# Patient Record
Sex: Female | Born: 1985 | Race: White | Hispanic: No | Marital: Married | Smoking: Former smoker
Health system: Southern US, Community
[De-identification: ages and names within clinical notes are randomized; demographics above are authoritative.]

## PROBLEM LIST (undated history)

## (undated) DIAGNOSIS — R519 Headache, unspecified: Secondary | ICD-10-CM

## (undated) DIAGNOSIS — F319 Bipolar disorder, unspecified: Secondary | ICD-10-CM

## (undated) DIAGNOSIS — D649 Anemia, unspecified: Secondary | ICD-10-CM

## (undated) DIAGNOSIS — R51 Headache: Secondary | ICD-10-CM

---

## 2007-08-01 ENCOUNTER — Emergency Department: Payer: Self-pay | Admitting: Emergency Medicine

## 2008-06-13 ENCOUNTER — Emergency Department: Payer: Self-pay

## 2008-06-14 ENCOUNTER — Emergency Department: Payer: Self-pay | Admitting: Emergency Medicine

## 2009-05-27 ENCOUNTER — Emergency Department: Payer: Self-pay | Admitting: Emergency Medicine

## 2009-08-29 ENCOUNTER — Ambulatory Visit: Payer: Self-pay | Admitting: Family Medicine

## 2009-11-29 ENCOUNTER — Inpatient Hospital Stay: Payer: Self-pay

## 2010-01-08 ENCOUNTER — Observation Stay: Payer: Self-pay | Admitting: Obstetrics and Gynecology

## 2010-01-10 ENCOUNTER — Inpatient Hospital Stay: Payer: Self-pay | Admitting: Unknown Physician Specialty

## 2010-01-16 ENCOUNTER — Emergency Department: Payer: Self-pay | Admitting: Emergency Medicine

## 2012-07-22 ENCOUNTER — Emergency Department (HOSPITAL_COMMUNITY): Payer: Self-pay

## 2012-07-22 ENCOUNTER — Encounter (HOSPITAL_COMMUNITY): Payer: Self-pay | Admitting: *Deleted

## 2012-07-22 ENCOUNTER — Emergency Department (HOSPITAL_COMMUNITY)
Admission: EM | Admit: 2012-07-22 | Discharge: 2012-07-22 | Disposition: A | Discharge status: Home / Self Care | Payer: Self-pay | Attending: Emergency Medicine | Admitting: Emergency Medicine

## 2012-07-22 DIAGNOSIS — R6883 Chills (without fever): Secondary | ICD-10-CM | POA: Insufficient documentation

## 2012-07-22 DIAGNOSIS — R05 Cough: Secondary | ICD-10-CM | POA: Insufficient documentation

## 2012-07-22 DIAGNOSIS — R0989 Other specified symptoms and signs involving the circulatory and respiratory systems: Secondary | ICD-10-CM | POA: Insufficient documentation

## 2012-07-22 DIAGNOSIS — F17200 Nicotine dependence, unspecified, uncomplicated: Secondary | ICD-10-CM | POA: Insufficient documentation

## 2012-07-22 DIAGNOSIS — Z862 Personal history of diseases of the blood and blood-forming organs and certain disorders involving the immune mechanism: Secondary | ICD-10-CM | POA: Insufficient documentation

## 2012-07-22 DIAGNOSIS — R059 Cough, unspecified: Secondary | ICD-10-CM | POA: Insufficient documentation

## 2012-07-22 DIAGNOSIS — IMO0001 Reserved for inherently not codable concepts without codable children: Secondary | ICD-10-CM | POA: Insufficient documentation

## 2012-07-22 DIAGNOSIS — J111 Influenza due to unidentified influenza virus with other respiratory manifestations: Secondary | ICD-10-CM

## 2012-07-22 HISTORY — DX: Anemia, unspecified: D64.9

## 2012-07-22 MED ORDER — OSELTAMIVIR PHOSPHATE 75 MG PO CAPS: 75.0000 mg | ORAL_CAPSULE | Freq: Two times a day (BID) | ORAL | Status: DC

## 2012-07-22 NOTE — ED Provider Notes (Signed)
History  This chart was scribed for [name], MD by Shari Heritage, ED Scribe. The patient was seen in room APFT21/APFT21. Patient's care was started at 1528.  CSN: [id number]  Arrival date & time 07/22/12  1501   First MD Initiated Contact with Patient 07/22/12 1528      Chief Complaint  Patient presents with  . Chills  . Generalized Body Aches  . Cough     Patient is a 26 y.o. female presenting with cough. The history is provided by the patient. No language interpreter was used.  Cough This is a recurrent problem. The current episode started more than 1 week ago. The problem occurs constantly. The problem has been gradually worsening (says that she started producing sputum several days ago). The cough is productive of sputum. Associated symptoms include chills and myalgias. Pertinent negatives include no chest pain and no headaches. She has tried nothing for the symptoms. She is a smoker. Her past medical history does not include asthma.    HPI Comments: Alejandra Stevenson is a 26 y.o. female who presents to the Emergency Department complaining of generalized body aches and chills that started today. Patient states that she has also had a lingering cough for the past month, but this week she started producing sputum. There is associated congestion. Patient denies nausea, vomiting or diarrhea. Patient has a medical history of anemia, but no other chronic medical conditions. She is a current every day smoker.   Past Medical History  Diagnosis Date  . Anemia     History reviewed. No pertinent past surgical history.  History reviewed. No pertinent family history.  History  Substance Use Topics  . Smoking status: Current Every Day Smoker -- 0.5 packs/day    Types: Cigarettes  . Smokeless tobacco: Not on file  . Alcohol Use: Yes    OB History    Grav Para Term Preterm Abortions TAB SAB Ect Mult Living                  Review of Systems  Constitutional: Positive for chills.  Negative for fatigue.  HENT: Positive for congestion. Negative for sinus pressure and ear discharge.   Eyes: Negative for discharge.  Respiratory: Positive for cough.   Cardiovascular: Negative for chest pain.  Gastrointestinal: Negative for nausea, vomiting, abdominal pain and diarrhea.  Genitourinary: Negative for frequency and hematuria.  Musculoskeletal: Positive for myalgias. Negative for back pain.  Skin: Negative for rash.  Neurological: Negative for seizures and headaches.  Hematological: Negative.   Psychiatric/Behavioral: Negative for hallucinations.    Allergies  Review of patient's allergies indicates no known allergies.  Home Medications  No current outpatient prescriptions on file.  Triage Vitals: BP 124/80  Pulse 96  Temp 98.2 F (36.8 C) (Oral)  Resp 18  Ht 5\' 3"  (1.6 m)  Wt 114 lb (51.71 kg)  BMI 20.19 kg/m2  SpO2 99%  LMP 06/27/2012  Physical Exam  Constitutional: She is oriented to person, place, and time. She appears well-developed.  HENT:  Head: Normocephalic and atraumatic.  Eyes: Conjunctivae normal and EOM are normal. No scleral icterus.  Neck: Neck supple. No thyromegaly present.  Cardiovascular: Normal rate and regular rhythm.  Exam reveals no gallop and no friction rub.   No murmur heard. Pulmonary/Chest: No stridor. She has no wheezes. She has no rales. She exhibits no tenderness.  Abdominal: She exhibits no distension. There is no tenderness. There is no rebound.  Musculoskeletal: Normal range of motion. She exhibits  no edema.  Lymphadenopathy:    She has no cervical adenopathy.  Neurological: She is oriented to person, place, and time. Coordination normal.  Skin: No rash noted. No erythema.  Psychiatric: She has a normal mood and affect. Her behavior is normal.    ED Course  Procedures (including critical care time) DIAGNOSTIC STUDIES: Oxygen Saturation is 99% on room air, normal by my interpretation.    COORDINATION OF CARE: 3:32  PM- Patient informed of current plan for treatment and evaluation and agrees with plan at this time.     Labs Reviewed - No data to display No results found.   No diagnosis found.    MDM    The chart was scribed for me under my direct supervision.  I personally performed the history, physical, and medical decision making and all procedures in the evaluation of this patient.[name], MD 07/22/12 [phone]

## 2012-07-22 NOTE — ED Notes (Signed)
Pt co cough x1 month, body aches and chills started today, denies n/v/d

## 2014-10-29 ENCOUNTER — Emergency Department (HOSPITAL_COMMUNITY)
Admission: EM | Admit: 2014-10-29 | Discharge: 2014-10-29 | Disposition: A | Discharge status: Home / Self Care | Payer: Self-pay | Attending: Emergency Medicine | Admitting: Emergency Medicine

## 2014-10-29 ENCOUNTER — Encounter (HOSPITAL_COMMUNITY): Payer: Self-pay | Admitting: *Deleted

## 2014-10-29 DIAGNOSIS — Z862 Personal history of diseases of the blood and blood-forming organs and certain disorders involving the immune mechanism: Secondary | ICD-10-CM | POA: Insufficient documentation

## 2014-10-29 DIAGNOSIS — Z720 Tobacco use: Secondary | ICD-10-CM | POA: Insufficient documentation

## 2014-10-29 DIAGNOSIS — G43909 Migraine, unspecified, not intractable, without status migrainosus: Secondary | ICD-10-CM | POA: Insufficient documentation

## 2014-10-29 HISTORY — DX: Headache: R51

## 2014-10-29 HISTORY — DX: Headache, unspecified: R51.9

## 2014-10-29 MED ORDER — KETOROLAC TROMETHAMINE 30 MG/ML IJ SOLN
30.0000 mg | Freq: Once | INTRAMUSCULAR | Status: AC
  Administered 2014-10-29: 30 mg via INTRAVENOUS
  Filled 2014-10-29: qty 1

## 2014-10-29 MED ORDER — METOCLOPRAMIDE HCL 5 MG/ML IJ SOLN
10.0000 mg | Freq: Once | INTRAMUSCULAR | Status: AC
  Administered 2014-10-29: 10 mg via INTRAVENOUS
  Filled 2014-10-29: qty 2

## 2014-10-29 MED ORDER — DIPHENHYDRAMINE HCL 50 MG/ML IJ SOLN
25.0000 mg | Freq: Once | INTRAMUSCULAR | Status: AC
  Administered 2014-10-29: 25 mg via INTRAVENOUS
  Filled 2014-10-29: qty 1

## 2014-10-29 MED ORDER — SODIUM CHLORIDE 0.9 % IV BOLUS (SEPSIS)
1000.0000 mL | Freq: Once | INTRAVENOUS | Status: AC
  Administered 2014-10-29: 1000 mL via INTRAVENOUS

## 2014-10-29 NOTE — ED Notes (Signed)
Headache, for 2 days, Vomiting today, Hx of similar headaches.  No HI

## 2014-10-29 NOTE — ED Notes (Signed)
Pt states she has had headaches since she was little. Pt is alert and oriented x 3. Lights cut off and pt given a warm blanket

## 2014-10-29 NOTE — ED Provider Notes (Signed)
CSN: [id number]     Arrival date & time 10/29/14  1338 History   First MD Initiated Contact with Patient 10/29/14 1404     Chief Complaint  Patient presents with  . Headache     (Consider location/radiation/quality/duration/timing/severity/associated sxs/prior Treatment) HPI  29 year old female with a past history of migraines presents with a migraine headache for the past 2 days. Has been gradually worsening since onset 2 days ago. Patient states this is a right-sided headache that feels like an ache. It is a 10/10. No fevers or neck stiffness. No weakness or numbness besides generalized fatigue. Has had a long history of migraine similar to this but not as severe. Tried ibuprofen yesterday with no relief. Usually she just sleeps off her migraines but states that did not work this time. Patient gets migraines about once every 2-3 months. Besides being more severe this headache is not worse or different than normal. Has mild photophobia and has vomited a couple times today. Due to her headache she missed work today and was told she would need a doctor's note before returning.  Past Medical History  Diagnosis Date  . Anemia   . Headache    History reviewed. No pertinent past surgical history. History reviewed. No pertinent family history. History  Substance Use Topics  . Smoking status: Current Every Day Smoker -- 0.50 packs/day    Types: Cigarettes  . Smokeless tobacco: Not on file  . Alcohol Use: No   OB History    No data available     Review of Systems  Constitutional: Negative for fever and chills.  Eyes: Positive for photophobia. Negative for visual disturbance.  Gastrointestinal: Positive for nausea and vomiting.  Genitourinary: Negative for menstrual problem.  Neurological: Positive for headaches. Negative for weakness and numbness.  All other systems reviewed and are negative.     Allergies  Review of patient's allergies indicates no known allergies.  Home  Medications   Prior to Admission medications   Medication Sig Start Date End Date Taking? Authorizing Provider  oseltamivir (TAMIFLU) 75 MG capsule Take 1 capsule (75 mg total) by mouth every 12 (twelve) hours. 07/22/12   Bethann BerkshireJoseph Zammit, MD  Phenyleph-CPM-DM-Aspirin (ALKA-SELTZER PLUS COLD & COUGH) 7.02-21-09-325 MG TBEF Take 2 tablets by mouth once.    Historical Provider, MD   BP 120/77 mmHg  Pulse 85  Temp(Src) 98.4 F (36.9 C) (Oral)  Resp 18  Ht 5\' 6"  (1.676 m)  Wt 130 lb (58.968 kg)  BMI 20.99 kg/m2  SpO2 100%  LMP 10/28/2014 Physical Exam  Constitutional: She is oriented to person, place, and time. She appears well-developed and well-nourished.  HENT:  Head: Normocephalic and atraumatic.  Right Ear: External ear normal.  Left Ear: External ear normal.  Nose: Nose normal.  Eyes: EOM are normal. Pupils are equal, round, and reactive to light. Right eye exhibits no discharge. Left eye exhibits no discharge.  Neck: Normal range of motion. Neck supple.  No meningismus  Cardiovascular: Normal rate, regular rhythm and normal heart sounds.   Pulmonary/Chest: Effort normal and breath sounds normal.  Abdominal: Soft. There is no tenderness.  Neurological: She is alert and oriented to person, place, and time.  Reflex Scores:      Bicep reflexes are 2+ on the right side and 2+ on the left side.      Patellar reflexes are 2+ on the right side and 2+ on the left side. CN 2-12 grossly intact. 5/5 strength in all 4 extremities. Normal finger  to nose  Skin: Skin is warm and dry.  Nursing note and vitals reviewed.   ED Course  Procedures (including critical care time) Labs Review Labs Reviewed - No data to display  Imaging Review No results found.   EKG Interpretation None      MDM   Final diagnoses:  Migraine without status migrainosus, not intractable, unspecified migraine type    Patient with a typical but more severe right-sided migraine headache. Normal neurologic  exam. Highly doubt SAH, other intracranial/extra-axial bleeding, meningitis/encephalitis or thrombosis. Patient feels significantly improved after IV medicine. Stable for D/C home.    Pricilla Loveless, MD 10/29/14 1537

## 2014-10-29 NOTE — Discharge Instructions (Signed)

## 2016-05-07 ENCOUNTER — Encounter (HOSPITAL_COMMUNITY): Payer: Self-pay

## 2016-05-07 ENCOUNTER — Emergency Department (HOSPITAL_COMMUNITY)
Admission: EM | Admit: 2016-05-07 | Discharge: 2016-05-12 | Disposition: A | Discharge status: Other Acute Inpatient Hospital | Payer: Self-pay | Attending: Emergency Medicine | Admitting: Emergency Medicine

## 2016-05-07 DIAGNOSIS — F23 Brief psychotic disorder: Secondary | ICD-10-CM

## 2016-05-07 DIAGNOSIS — F309 Manic episode, unspecified: Secondary | ICD-10-CM | POA: Insufficient documentation

## 2016-05-07 DIAGNOSIS — F28 Other psychotic disorder not due to a substance or known physiological condition: Secondary | ICD-10-CM | POA: Insufficient documentation

## 2016-05-07 DIAGNOSIS — F3010 Manic episode without psychotic symptoms, unspecified: Secondary | ICD-10-CM

## 2016-05-07 DIAGNOSIS — F29 Unspecified psychosis not due to a substance or known physiological condition: Secondary | ICD-10-CM

## 2016-05-07 DIAGNOSIS — F312 Bipolar disorder, current episode manic severe with psychotic features: Secondary | ICD-10-CM

## 2016-05-07 DIAGNOSIS — Z87891 Personal history of nicotine dependence: Secondary | ICD-10-CM | POA: Insufficient documentation

## 2016-05-07 DIAGNOSIS — Z79899 Other long term (current) drug therapy: Secondary | ICD-10-CM | POA: Insufficient documentation

## 2016-05-07 LAB — CBC WITH DIFFERENTIAL/PLATELET
BASOS ABS: 0 10*3/uL (ref 0.0–0.1)
BASOS PCT: 0 %
EOS ABS: 0 10*3/uL (ref 0.0–0.7)
Eosinophils Relative: 0 %
HCT: 38.8 % (ref 36.0–46.0)
HEMOGLOBIN: 13.8 g/dL (ref 12.0–15.0)
Lymphocytes Relative: 10 %
Lymphs Abs: 1.3 10*3/uL (ref 0.7–4.0)
MCH: 28.9 pg (ref 26.0–34.0)
MCHC: 35.6 g/dL (ref 30.0–36.0)
MCV: 81.3 fL (ref 78.0–100.0)
MONO ABS: 0.3 10*3/uL (ref 0.1–1.0)
MONOS PCT: 3 %
NEUTROS ABS: 11.4 10*3/uL — AB (ref 1.7–7.7)
NEUTROS PCT: 87 %
Platelets: 260 10*3/uL (ref 150–400)
RBC: 4.77 MIL/uL (ref 3.87–5.11)
RDW: 12.3 % (ref 11.5–15.5)
WBC: 13 10*3/uL — ABNORMAL HIGH (ref 4.0–10.5)

## 2016-05-07 LAB — COMPREHENSIVE METABOLIC PANEL
ALBUMIN: 4.5 g/dL (ref 3.5–5.0)
ALT: 10 U/L — ABNORMAL LOW (ref 14–54)
ANION GAP: 9 (ref 5–15)
AST: 18 U/L (ref 15–41)
Alkaline Phosphatase: 56 U/L (ref 38–126)
CO2: 21 mmol/L — AB (ref 22–32)
Calcium: 9.3 mg/dL (ref 8.9–10.3)
Chloride: 106 mmol/L (ref 101–111)
Creatinine, Ser: 0.72 mg/dL (ref 0.44–1.00)
GFR calc Af Amer: >60 mL/min (ref >60)
GFR calc non Af Amer: >60 mL/min (ref >60)
GLUCOSE: 109 mg/dL — AB (ref 65–99)
POTASSIUM: 2.9 mmol/L — AB (ref 3.5–5.1)
SODIUM: 136 mmol/L (ref 135–145)
Total Bilirubin: 0.9 mg/dL (ref 0.3–1.2)
Total Protein: 7.7 g/dL (ref 6.5–8.1)

## 2016-05-07 LAB — RAPID URINE DRUG SCREEN, HOSP PERFORMED
AMPHETAMINES: NOT DETECTED
BARBITURATES: NOT DETECTED
Benzodiazepines: NOT DETECTED
Cocaine: NOT DETECTED
OPIATES: NOT DETECTED
TETRAHYDROCANNABINOL: NOT DETECTED

## 2016-05-07 LAB — ETHANOL: Alcohol, Ethyl (B): <5 mg/dL (ref <5)

## 2016-05-07 LAB — I-STAT BETA HCG BLOOD, ED (MC, WL, AP ONLY): I-stat hCG, quantitative: <5 m[IU]/mL (ref <5)

## 2016-05-07 LAB — SALICYLATE LEVEL: Salicylate Lvl: <7 mg/dL (ref 2.8–30.0)

## 2016-05-07 LAB — ACETAMINOPHEN LEVEL

## 2016-05-07 MED ORDER — STERILE WATER FOR INJECTION IJ SOLN
INTRAMUSCULAR | Status: AC
  Administered 2016-05-07: 1.2 mL
  Filled 2016-05-07: qty 10

## 2016-05-07 MED ORDER — POTASSIUM CHLORIDE CRYS ER 20 MEQ PO TBCR
40.0000 meq | EXTENDED_RELEASE_TABLET | Freq: Every day | ORAL | Status: DC
  Administered 2016-05-08 – 2016-05-11 (×3): 40 meq via ORAL
  Filled 2016-05-07 (×4): qty 2

## 2016-05-07 MED ORDER — ZIPRASIDONE MESYLATE 20 MG IM SOLR
20.0000 mg | Freq: Four times a day (QID) | INTRAMUSCULAR | Status: DC | PRN
  Administered 2016-05-07 – 2016-05-08 (×4): 20 mg via INTRAMUSCULAR
  Filled 2016-05-07 (×4): qty 20

## 2016-05-07 MED ORDER — POTASSIUM CHLORIDE CRYS ER 20 MEQ PO TBCR
40.0000 meq | EXTENDED_RELEASE_TABLET | Freq: Once | ORAL | Status: AC
  Administered 2016-05-07: 40 meq via ORAL
  Filled 2016-05-07: qty 2

## 2016-05-07 MED ORDER — LORAZEPAM 2 MG/ML IJ SOLN
2.0000 mg | Freq: Once | INTRAMUSCULAR | Status: AC
  Administered 2016-05-07: 2 mg via INTRAMUSCULAR
  Filled 2016-05-07: qty 1

## 2016-05-07 MED ORDER — STERILE WATER FOR INJECTION IJ SOLN
INTRAMUSCULAR | Status: AC
  Administered 2016-05-07: 1 mL
  Filled 2016-05-07: qty 10

## 2016-05-07 NOTE — ED Notes (Signed)
Bed: RESA Expected date:  Expected time:  Means of arrival:  Comments: EMS/[facility]. 

## 2016-05-07 NOTE — ED Notes (Signed)
Pt urinated on the floor after stripping clothes completely off.
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
## 2016-05-07 NOTE — BH Assessment (Signed)
TTS ordered for this patient. Patient was not able to be aroused. TTS consult removed and will be reordered when patient awakens.

## 2016-05-07 NOTE — ED Notes (Signed)
Per sitter, Pt will abruptly wake up, sit straight up in the bed, look alarmed, and then, lay back down.  Pt remains restrained d/t alarming behavior.

## 2016-05-07 NOTE — ED Notes (Signed)
Pt started to get OOB screaming about Jesus.

## 2016-05-07 NOTE — ED Provider Notes (Signed)
[facility]-EMERGENCY DEPT Provider Note   CSN: [id number] Arrival date & time: 05/07/16  1026     History   Chief Complaint Chief Complaint  Patient presents with  . Manic Behavior    HPI Alejandra Stevenson is a 30 y.o. female.  She presents accompanied by [employer] and EMS from her home. History is obtained from paramedics, EMS, and ultimately the patient's husband, and brother-in-law.  Pt is a Curator. Has never been "overly religious" per her husband's report.  [place] with women from her church this weekend. She returned Saturday. Husband states she was "tired and excited but normal". By Saturday night he states she was still rather excited but that she slept Saturday night and seemed normal yesterday on Sunday. By last night he states that she was acting strange and "hyper". Apparently she called ministers wife at midnight wanting to talk to her about "God".  Husband states that this morning she felt someone was in the house. She wanted someone to come over and "bless the house". On arrival here she feels that the paramedics or God and Jesus, and that I am the devil.    HPI  Past Medical History:  Diagnosis Date  . Anemia   . Headache     There are no active problems to display for this patient.   History reviewed. No pertinent surgical history.  OB History    No data available       Home Medications    Prior to Admission medications   Not on File    Family History History reviewed. No pertinent family history.  Social History Social History  Substance Use Topics  . Smoking status: Former Smoker    Packs/day: 0.50    Types: Cigarettes  . Smokeless tobacco: Never Used  . Alcohol use No     Allergies   Review of patient's allergies indicates no known allergies.   Review of Systems Review of Systems  Unable to perform ROS: Mental status change     Physical Exam Updated Vital Signs BP 127/91    Pulse 100   Temp 98.6 F (37 C) (Oral)   Resp 18   LMP  (LMP Unknown)   SpO2 100%   Physical Exam  Constitutional: She is oriented to person, place, and time. She appears well-developed and well-nourished. She appears distressed.  HENT:  Head: Normocephalic.  Eyes: Conjunctivae are normal. Pupils are equal, round, and reactive to light. No scleral icterus.  Neck: Normal range of motion. Neck supple. No thyromegaly present.  Cardiovascular: Normal rate and regular rhythm.  Exam reveals no gallop and no friction rub.   No murmur heard. Pulmonary/Chest: Effort normal and breath sounds normal. No respiratory distress. She has no wheezes. She has no rales.  Abdominal: Soft. Bowel sounds are normal. She exhibits no distension. There is no tenderness. There is no rebound.  Musculoskeletal: Normal range of motion. Deformity: secondary, the patient is hyperactive and manic. Intimately singing religious hymns. At times will stare at the ceiling states she sees god. She points at me and states that I am the devil. She feels that the 2 paramedics or Jesus and God. Attempts to remo.  Neurological: She is alert and oriented to person, place, and time.  Skin: Skin is warm and dry. No rash noted.  Psychiatric:  Attempts to remove monitoring devices and flee.     ED Treatments / Results  Labs (all labs ordered are listed,  but only abnormal results are displayed) Labs Reviewed  ACETAMINOPHEN LEVEL - Abnormal; Notable for the following:       Result Value   Acetaminophen (Tylenol), Serum <10 (*)    All other components within normal limits  CBC WITH DIFFERENTIAL/PLATELET - Abnormal; Notable for the following:    WBC 13.0 (*)    Neutro Abs 11.4 (*)    All other components within normal limits  COMPREHENSIVE METABOLIC PANEL - Abnormal; Notable for the following:    Potassium 2.9 (*)    CO2 21 (*)    Glucose, Bld 109 (*)    BUN <5 (*)    ALT 10 (*)    All other components within normal limits   ETHANOL  SALICYLATE LEVEL  RAPID URINE DRUG SCREEN, HOSP PERFORMED  I-STAT BETA HCG BLOOD, ED ([facility], [facility], [facility] ONLY)    EKG  EKG Interpretation None       Radiology No results found.  Procedures Procedures (including critical care time)  Medications Ordered in ED Medications  ziprasidone (GEODON) injection 20 mg (20 mg Intramuscular Given 05/07/16 1107)  sterile water (preservative free) injection (1.2 mLs  Given 05/07/16 1108)  LORazepam (ATIVAN) injection 2 mg (2 mg Intramuscular Given 05/07/16 1157)     Initial Impression / Assessment and Plan / ED Course  I have reviewed the triage vital signs and the nursing notes.  Pertinent labs & imaging results that were available during my care of the patient were reviewed by me and considered in my medical decision making (see chart for details).  Clinical Course    Patient with apparent psychotic break with delusions and hallucinations. Hyperreligious undertones. Was restrained for her safety. Feel she needs psychiatric care to prevent deterioration. Was given IM Geodon and IM Ativan and had improvement and was more calm. Awaiting for her to awaken from the sedative effects for psychiatric evaluation. Medical evaluation is unrevealing.  Final Clinical Impressions(s) / ED Diagnoses   Final diagnoses:  Manic behavior (HCC)  Other psychotic disorder not due to substance or known physiological condition    New Prescriptions New Prescriptions   No medications on file     Rolland PorterMark Ishanvi Mcquitty, MD 05/07/16 1701

## 2016-05-07 NOTE — ED Notes (Signed)
Remaining restraints removed from pt and pt ambulated to restroom without difficulty. Pt changed into purple paper scrubs. Sitter at bedside.

## 2016-05-07 NOTE — ED Provider Notes (Signed)
I assumed care of this patient from Dr. Fayrene FearingJames at 1700.  Please see their note for further details of Hx, PE.  Briefly patient is a 30 y.o. female with acute psychosis requiring chemical sedation.  Current plan is to follow up medical clearance labs [facility]  Labs with notable hypokalemia we'll replete. Otherwise labs are reassuring without evidence that would account for acute psychosis.  Patient awoke and stable. TTS consulted, who recommended inpatient treatment. Currently awaiting placement.    Nira ConnPedro Eduardo Tatum Massman, MD 05/08/16 [phone]

## 2016-05-07 NOTE — ED Triage Notes (Addendum)
Per [facility], Pt presents manic and hyper religious.  Pt's husband called EMS and reported this behavior has been going in x 4 hours.  [place]  EMS reports Pt attempted to jump off the stretcher several times en route.  Pt denies medical history and complaints.  Denies pain.  Denies drug and ETOH use.        Family Hx of ADHD and Bipolar.    During triage assessment, Pt jumped up, stripped off clothing, and proceeded to urinate in the floor.  Pt referring to staff as "Jesus" and "God."  Pt continually reports that staff "needs Jesus" and will laugh w/o known stimulus.  Pt will rub abdomen and smile.  LMP unknown.         MD at bedside and reports that he will IVC Pt.

## 2016-05-07 NOTE — ED Notes (Signed)
Pt continues to scream, yelling "God I need you right now!"  States she sees Jesus with the light on or off.
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
## 2016-05-07 NOTE — ED Notes (Signed)
Report given to Barbara RN

## 2016-05-07 NOTE — ED Notes (Signed)
Right wrist restraint released after pt calm and cooperative at this time. To reassess pt in 15 min for further limb release.

## 2016-05-07 NOTE — ED Notes (Signed)
TTS consult discontinued until Pt is more alert.

## 2016-05-07 NOTE — Progress Notes (Signed)
Pt without completed ED registration  Pt noted throughout shift to be hyper religious, casting out satan, etc Pt in restraints with sitter at bedside Cm introduced herself and inquired if pt has a family doctor or pcp Pt informs CM she seems to remember having one in the hospital as "mark james" Pt is correct there is an ECP with that name but not a pcp  Pt offered uninsured [place] resources placed in her pt belonging bag in which she said "ok" to when offered Cm returned to ask pt her name and she informed CM her name was " Mauri Readingamanda Spickard, my husband name is jimmy Longie" and stated she had a daughter Pt rubbed her stomach and stated she had "one not yet born. I'm going to name Kyla.  I may not. I'm going to think about that. God will help me with a name."  When asked how many months she was Pt stated "about three months"   ED CM left pt uninsured [place] [facility] in her pt belonging bag at nursing station   CM provided written information to assist pt with determining choice for uninsured accepting pcps, discussed the importance of pcp vs EDP services for f/u care, www.needymeds.org, www.goodrx.com, discounted pharmacies and other [place] resources such as [facility] , [facility], affordable care act, financial assistance, uninsured dental services, [place] med assist, DSS and  health department  Provided resources for [place] uninsured accepting pcps like [facility], family medicine at [address], [facility], [facility], local urgent care centers, [facility], [facility], general medical clinics, family services of the [facility], [facility] urgent care plus others, medication resources, [facility] out patient pharmacies and housing Provided [facility] contact information

## 2016-05-08 DIAGNOSIS — Z87891 Personal history of nicotine dependence: Secondary | ICD-10-CM

## 2016-05-08 DIAGNOSIS — F309 Manic episode, unspecified: Secondary | ICD-10-CM

## 2016-05-08 LAB — BASIC METABOLIC PANEL
ANION GAP: 8 (ref 5–15)
BUN: 5 mg/dL — AB (ref 6–20)
CALCIUM: 9.4 mg/dL (ref 8.9–10.3)
CO2: 23 mmol/L (ref 22–32)
Chloride: 109 mmol/L (ref 101–111)
Creatinine, Ser: 0.65 mg/dL (ref 0.44–1.00)
GFR calc Af Amer: >60 mL/min (ref >60)
Glucose, Bld: 102 mg/dL — ABNORMAL HIGH (ref 65–99)
POTASSIUM: 3.6 mmol/L (ref 3.5–5.1)
SODIUM: 140 mmol/L (ref 135–145)

## 2016-05-08 MED ORDER — TRAZODONE HCL 100 MG PO TABS
100.0000 mg | ORAL_TABLET | Freq: Every day | ORAL | Status: DC
  Administered 2016-05-08 – 2016-05-11 (×4): 100 mg via ORAL
  Filled 2016-05-08 (×5): qty 1

## 2016-05-08 MED ORDER — STERILE WATER FOR INJECTION IJ SOLN
INTRAMUSCULAR | Status: AC
  Administered 2016-05-08: 02:00:00
  Filled 2016-05-08: qty 10

## 2016-05-08 MED ORDER — OLANZAPINE 10 MG PO TBDP: 10.0000 mg | ORAL_TABLET | Freq: Two times a day (BID) | ORAL | Status: DC

## 2016-05-08 MED ORDER — STERILE WATER FOR INJECTION IJ SOLN
INTRAMUSCULAR | Status: AC
  Administered 2016-05-08: 10 mL
  Filled 2016-05-08: qty 10

## 2016-05-08 MED ORDER — LORAZEPAM 2 MG/ML IJ SOLN
2.0000 mg | Freq: Once | INTRAMUSCULAR | Status: AC
  Administered 2016-05-08: 2 mg via INTRAMUSCULAR
  Filled 2016-05-08: qty 1

## 2016-05-08 MED ORDER — ASENAPINE MALEATE 5 MG SL SUBL
10.0000 mg | SUBLINGUAL_TABLET | Freq: Two times a day (BID) | SUBLINGUAL | Status: DC
  Administered 2016-05-08 – 2016-05-11 (×6): 10 mg via SUBLINGUAL
  Filled 2016-05-08 (×9): qty 2
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
## 2016-05-08 NOTE — Progress Notes (Signed)
Pt running around room naked, can not get pt to get in to the bed.  Geodon not working yet. Sitter present, pt is ivc'd. Alejandra Stevenson P [name]

## 2016-05-08 NOTE — BH Assessment (Signed)
[facility] Assessment Progress Note  Per Thedore MinsMojeed Akintayo, MD, this pt requires psychiatric hospitalization at this time.  The following facilities have been contacted to seek placement for this pt, with results as noted:  Beds available, information sent, decision pending:  [name]   At capacity:  [name] ([name]   [name], [place] Triage Specialist [phone]
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
## 2016-05-09 ENCOUNTER — Emergency Department (HOSPITAL_COMMUNITY): Payer: Self-pay

## 2016-05-09 ENCOUNTER — Other Ambulatory Visit: Payer: Self-pay

## 2016-05-09 DIAGNOSIS — F312 Bipolar disorder, current episode manic severe with psychotic features: Secondary | ICD-10-CM | POA: Diagnosis present

## 2016-05-09 LAB — TSH: TSH: 0.611 u[IU]/mL (ref 0.350–4.500)

## 2016-05-09 LAB — URINALYSIS, ROUTINE W REFLEX MICROSCOPIC
BILIRUBIN URINE: NEGATIVE
Glucose, UA: NEGATIVE mg/dL
HGB URINE DIPSTICK: NEGATIVE
Ketones, ur: 15 mg/dL — AB
Leukocytes, UA: NEGATIVE
NITRITE: NEGATIVE
PROTEIN: NEGATIVE mg/dL
SPECIFIC GRAVITY, URINE: 1.018 (ref 1.005–1.030)
pH: 6 (ref 5.0–8.0)

## 2016-05-09 LAB — VITAMIN B12: Vitamin B-12: 337 pg/mL (ref 180–914)

## 2016-05-09 LAB — FOLATE: Folate: 15.8 ng/mL (ref >5.9)

## 2016-05-09 LAB — PREGNANCY, URINE: PREG TEST UR: NEGATIVE

## 2016-05-09 MED ORDER — LORAZEPAM 1 MG PO TABS
1.0000 mg | ORAL_TABLET | Freq: Four times a day (QID) | ORAL | Status: DC | PRN
  Filled 2016-05-09: qty 1

## 2016-05-09 MED ORDER — DIPHENHYDRAMINE HCL 50 MG/ML IJ SOLN
50.0000 mg | Freq: Once | INTRAMUSCULAR | Status: AC
  Administered 2016-05-09: 50 mg via INTRAMUSCULAR
  Filled 2016-05-09: qty 1

## 2016-05-09 MED ORDER — LORAZEPAM 2 MG/ML IJ SOLN
2.0000 mg | Freq: Once | INTRAMUSCULAR | Status: AC
  Administered 2016-05-09: 2 mg via INTRAMUSCULAR
  Filled 2016-05-09: qty 1

## 2016-05-09 MED ORDER — LORAZEPAM 1 MG PO TABS
1.0000 mg | ORAL_TABLET | Freq: Three times a day (TID) | ORAL | Status: DC
  Administered 2016-05-09 – 2016-05-10 (×2): 1 mg via ORAL
  Filled 2016-05-09 (×3): qty 1

## 2016-05-09 MED ORDER — ZIPRASIDONE MESYLATE 20 MG IM SOLR
20.0000 mg | Freq: Once | INTRAMUSCULAR | Status: AC
  Administered 2016-05-09: 20 mg via INTRAMUSCULAR
  Filled 2016-05-09: qty 20

## 2016-05-09 MED ORDER — OXCARBAZEPINE 300 MG PO TABS
300.0000 mg | ORAL_TABLET | Freq: Two times a day (BID) | ORAL | Status: DC
  Administered 2016-05-09 – 2016-05-11 (×4): 300 mg via ORAL
  Filled 2016-05-09 (×5): qty 1
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
## 2016-05-09 NOTE — BH Assessment (Signed)
[facility] Assessment Progress Note  Per Thedore MinsMojeed Akintayo, MD, this pt continues to require psychiatric hospitalization at this time.  The following facilities have been contacted to seek placement for this pt, with results as noted:  Beds available, information sent, decision pending:  [facility]   At capacity:  [facility]   Lanessa Shill, [place] Triage Specialist [phone]

## 2016-05-09 NOTE — BH Assessment (Addendum)
BHH Assessment Progress Note  At the request of Thedore MinsMojeed Akintayo, MD, this writer called pt's husband, Marice PotterJames Weimar [phone]), to obtain history.  Call was placed from 14:07 - 14:28.  Mr Janina MayoWilkes reports that, to his knowledge, pt has no history of mental health problems.  She has never been hospitalized for psychiatric treatment, has never seen a behavioral health professional for outpatient treatment, and has never been on psychotropic medications.  He reports that shortly after delivering their 32 year old son, she had a brief period of mild lethargy and anhedonia, but this resolved without any professional help.  When asked if pt is currently on medications of any kind, or if she went through a recent medication change, he reports that she takes ibuprofen occasionally.  About a year ago she was given an oral medication, possibly Z-pack, for bronchitis.  He denies any recent use of steroid medications.  She has no reported history of a seizure disorder.  When asked about problems with urinary tract infections, he replies that when she was pregnant with their 32 year old son, she did have a couple UTI's, but clarified that these were yeast infections.  When asked if pt has any history of substance abuse, Mr Janina MayoWilkes reports that in the past she has drank alcohol, specifying 2 - 3 beers or a mixed drink, but only occasionally and socially.  She has not used alcohol in the past 1 - 1.5 years.  When asked about stressors, Mr Janina MayoWilkes reports a number of deaths in the family in the past year, including pt's father, pt's uncle, Mr Janina MayoWilkes step-father, and his uncle.  Mr Janina MayoWilkes confirms pt's report that Child Protective Services visited their home about 2 - 3 weeks ago.  He reports that they had a conflict with a neighbor that resulted in the neighbor calling CPS apparently out of spite.  CPS visited, but did not know what they were supposed to be looking for; they reported that they will be returning for a follow  up, but Mr Janina MayoWilkes minimizes any concern about the outcome of this.  A couple weeks earlier he reports that they had a dispute with their landlord, that could have resulted in eviction, about the condition of the property.  As a result they have had to get rid of their pets, including the pt's dog that used to follow her around the house.  Recently one of their two cars broke down, creating some transportation related stress.  Additionally, their 32 year old son has problems with inattentiveness, and [facility].  However, Mr Janina MayoWilkes reports that their marriage is intact.  He denies any unusual financial stressors.  Mr Janina MayoWilkes reports that pt's father was diagnosed with Bipolar Disorder.  He notes that when he visited his wife in the ED today she started saying, "I forgive you daddy," repeatedly.  He did not know what this might reference, and so he asked pt's mother, who also has no context in which to place the statement.  I then asked him about the names of two people that pt mentioned in her assessment, Jerolyn ShinLeroy Pinnix and Lexmark InternationalCharles Justice.  He believes that they may have been acquaintances or co-workers of pt's father, but is uncertain about this.  These details will be staffed with Dr [name] tomorrow, 05/10/2016.  Doylene Canninghomas Jalynn Betzold, MA Triage Specialist [phone]

## 2016-05-09 NOTE — ED Notes (Signed)
Redness noted on patients elbows from shearing/ pushing herself up onto her elbows in bed. No breakdown noted. Allevyn foam border gauze applied to each elbow.

## 2016-05-09 NOTE — Progress Notes (Signed)
Pt also provided with a list of free clinics for [place] area

## 2016-05-09 NOTE — Progress Notes (Signed)
[facility]ed at [facility] progression meeting ; recommendation for further ED labs to check pregnancy and UA

## 2016-05-09 NOTE — ED Notes (Signed)
Patient slept short time, she is now spitting and trying to make herself gag.

## 2016-05-09 NOTE — ED Notes (Signed)
Pt is now following directions, she is awake and knows that she is at the hospital but not quite sure why. She has been taken out of restraints and is cooperating with staff and no longer displaying actions of trying harm herself or staff. Pt explained that if she begins to display those behaviors again and becomes a harm to either herself or staff again she will be placed back into restraints. Pt verbalizes understanding and contracts safety. Pt walked to bathroom and dinner tray finished, Will continue to monitor.
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
## 2016-05-09 NOTE — ED Notes (Signed)
Attempted to complete CT scan but pt was not cooperative. Pt refused to follow commands and nursing staff had to have the assistance of security to get pt back into the bed and back into her 4 point restraints. Will notify MD and continue to monitor the pt.

## 2016-05-09 NOTE — ED Notes (Addendum)
Patient is talking to self, and people not in room, as well as to the sitter and singing songs.  Patient stated that "our  daddy said it was ok to get naked for Jesus."

## 2016-05-09 NOTE — ED Notes (Signed)
Pt took body wash that was in her room,and placed in the cup, swished contents in her mouth and spit on the floor. She also took bedside table in the bathroom with her as she sat on the commode to void and defecate.  The items were removed and taken out of her room.

## 2016-05-10 LAB — VITAMIN D 25 HYDROXY (VIT D DEFICIENCY, FRACTURES): Vit D, 25-Hydroxy: 17.6 ng/mL — ABNORMAL LOW (ref 30.0–100.0)

## 2016-05-10 LAB — T4: T4 TOTAL: 10.6 ug/dL (ref 4.5–12.0)

## 2016-05-10 LAB — T3: T3 TOTAL: 120 ng/dL (ref 71–180)

## 2016-05-10 MED ORDER — ZIPRASIDONE MESYLATE 20 MG IM SOLR
20.0000 mg | Freq: Once | INTRAMUSCULAR | Status: AC
  Administered 2016-05-10: 20 mg via INTRAMUSCULAR
  Filled 2016-05-10: qty 20

## 2016-05-10 MED ORDER — STERILE WATER FOR INJECTION IJ SOLN
INTRAMUSCULAR | Status: AC
  Administered 2016-05-10: 10 mL
  Filled 2016-05-10: qty 10

## 2016-05-10 MED ORDER — CLONAZEPAM 1 MG PO TABS
1.0000 mg | ORAL_TABLET | Freq: Two times a day (BID) | ORAL | Status: DC
  Administered 2016-05-10 – 2016-05-12 (×5): 1 mg via ORAL
  Filled 2016-05-10 (×5): qty 1

## 2016-05-10 MED ORDER — LORAZEPAM 1 MG PO TABS
1.0000 mg | ORAL_TABLET | Freq: Once | ORAL | Status: AC
  Administered 2016-05-10: 1 mg via ORAL
  Filled 2016-05-10: qty 1

## 2016-05-10 NOTE — BH Assessment (Signed)
BHH Assessment Progress Note    This clinician contacted pending facilities.  [facility], [facility],  [facility]- left a message for [facility]- nurse in intake, [facility] spoke with Diane. They have the patients paperwork. Still considering referral. Will call if they have a bed.

## 2016-05-10 NOTE — ED Notes (Signed)
Pt husband at bedside

## 2016-05-10 NOTE — ED Notes (Signed)
Safety check- This nurse and Tarri Glennheressa, MHT to search person for contraband. No contraband found,

## 2016-05-10 NOTE — Progress Notes (Signed)
Attending Provider and Psychiatry Provider made aware of Persistent Tachycardia. Patient is anxious but calm when redirected. Will continue to monitor patient.

## 2016-05-10 NOTE — Progress Notes (Signed)
Patient is calm and cooperative when administrating medications. Patient is taking medications whole with liquids. Patient is anxious, but easily redirected.

## 2016-05-10 NOTE — BHH Counselor (Signed)
BHH Assessment Progress Note  Writer re-assessed pt this morning. Pt was pleasant and indicated that she was doing a lot better. Pt then shared with writer that she doesn't think she's [name] anymore, but is thinking that her name is now Alejandra Stevenson. Pt added that she woke up feeling funny and she thinks its vivid dreams.   Dr. [name] and [name], DNP, continue to recommend IP treatment for pt.   Johny ShockSamantha M. Ladona Ridgelaylor, MS, NCC, LPCA Counselor

## 2016-05-10 NOTE — Progress Notes (Signed)
05/10/16 1346:  Pt was sleep.  Caroll RancherMarjette Leilyn Stevenson, LRT/CTRS

## 2016-05-10 NOTE — ED Notes (Signed)
EDP consulted in regards to pt pulse. No new orders at this time. Will continue to monitor.
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
## 2016-05-11 DIAGNOSIS — F312 Bipolar disorder, current episode manic severe with psychotic features: Secondary | ICD-10-CM

## 2016-05-11 DIAGNOSIS — Z79899 Other long term (current) drug therapy: Secondary | ICD-10-CM

## 2016-05-11 MED ORDER — IBUPROFEN 200 MG PO TABS
600.0000 mg | ORAL_TABLET | Freq: Four times a day (QID) | ORAL | Status: DC | PRN
  Administered 2016-05-11: 600 mg via ORAL
  Filled 2016-05-11: qty 3

## 2016-05-11 MED ORDER — ASENAPINE MALEATE 5 MG SL SUBL
5.0000 mg | SUBLINGUAL_TABLET | Freq: Two times a day (BID) | SUBLINGUAL | Status: DC
  Administered 2016-05-11 – 2016-05-12 (×2): 5 mg via SUBLINGUAL
  Filled 2016-05-11 (×2): qty 1

## 2016-05-11 MED ORDER — NEOMYCIN-POLYMYXIN-PRAMOXINE 1 % EX CREA: TOPICAL_CREAM | Freq: Two times a day (BID) | CUTANEOUS | Status: DC

## 2016-05-11 MED ORDER — OXCARBAZEPINE 150 MG PO TABS
150.0000 mg | ORAL_TABLET | Freq: Two times a day (BID) | ORAL | Status: DC
  Administered 2016-05-11 – 2016-05-12 (×2): 150 mg via ORAL
  Filled 2016-05-11 (×2): qty 1

## 2016-05-11 MED ORDER — BACITRACIN-NEOMYCIN-POLYMYXIN 400-5-5000 EX OINT
TOPICAL_OINTMENT | Freq: Two times a day (BID) | CUTANEOUS | Status: DC
  Administered 2016-05-11: 1 via TOPICAL
  Administered 2016-05-11: 21:00:00 via TOPICAL
  Administered 2016-05-12: 1 via TOPICAL
  Filled 2016-05-11 (×3): qty 1
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
## 2016-05-11 NOTE — ED Notes (Signed)
This AM patient had 1 ear plug in, pointed at it when nurse came in stating "I plugged this ear so I can't hear the devil, the other one is open for God." Stated she felt "Good, I've been listening to Patient throughout day disclosing delusions of being pregnant.  At one point came to staff with trail of fluid behind her in hallway, sheets were saturated with with liquid.  Stated "my water broke."  Patient prompted by female nurse to get in shower, given change of clothes, room and floor cleaned by housekeeping, new linens provided.  Patient asked for another pregnancy test, she has one blood and urine test resulted and negative.  Oriented x4.  Was asking for EKG today, reporting fear that she might have a heart attack.  EKG taken as patient was tachycardic, denied chest pain and shortness of breath.  Orthostatic vitals taken ,EKG taken, showed sinus tachycardia.  Had 144 pulse when standing.  Provider- NP notified and aware, no new orders received.   Encouraged to drink fluids.  Patient taking medicines without protest.  Able to reorient patient from delusions, but she returns to them quickly.

## 2016-05-11 NOTE — Progress Notes (Signed)
05/11/16 1338:  Pt was sleep.   Alejandra RancherMarjette Reather Steller, LRT/CTRS

## 2016-05-11 NOTE — ED Notes (Signed)
Patient obsessed with menstrual cycle and states " I'm getting ready to have baby Jesus". Encouragement and support provided and safety maintain. Q 15 min safety checks remain in place.

## 2016-05-11 NOTE — ED Notes (Signed)
Pt encouraged to increase water intake per nursing order.

## 2016-05-11 NOTE — ED Notes (Signed)
When Clinical research associatewriter took pt her medication pt commented that she "didn't need to swallow them all the way." when taking the medication she took them into her mouth, drank, and then pulled the remaining solid pills out of her mouth. When writer asked her to open her hand to reveal the pills, writer informed pt that the next step if she continued to behave this way would be to receive then medications in shot form. Pt picked back up the pills and took them. Pt appeared more confused by the interaction than really refusing medication. Pt also drank full cup of water with pills at nurse encouragement.
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
## 2016-05-11 NOTE — ED Notes (Signed)
Patient has to constantly be re-directed to her room. Patient has been talking on phone while phone is turned off. Patient appears confused and is repetitive in her conversation/

## 2016-05-11 NOTE — ED Notes (Signed)
SBAR Report received from previous nurse. Pt received calm and visible on unit. Pt denies current SI/ HI, A/V H, depression, anxiety, and pain at this time, and is otherwise stable. Pt reminded of camera surveillance, q 15 min rounds, and rules of the milieu. Pt screened for contraband by writer, will continue to assess. 

## 2016-05-12 ENCOUNTER — Encounter: Payer: Self-pay | Admitting: Psychiatry

## 2016-05-12 ENCOUNTER — Inpatient Hospital Stay
Admission: RE | Admit: 2016-05-12 | Discharge: 2016-05-21 | DRG: 885 | Disposition: A | Discharge status: Home / Self Care | Payer: No Typology Code available for payment source | Source: Intra-hospital | Attending: Psychiatry | Admitting: Psychiatry

## 2016-05-12 DIAGNOSIS — G4700 Insomnia, unspecified: Secondary | ICD-10-CM | POA: Diagnosis present

## 2016-05-12 DIAGNOSIS — Z818 Family history of other mental and behavioral disorders: Secondary | ICD-10-CM | POA: Diagnosis not present

## 2016-05-12 DIAGNOSIS — F312 Bipolar disorder, current episode manic severe with psychotic features: Secondary | ICD-10-CM | POA: Diagnosis present

## 2016-05-12 DIAGNOSIS — Z23 Encounter for immunization: Secondary | ICD-10-CM

## 2016-05-12 DIAGNOSIS — E876 Hypokalemia: Secondary | ICD-10-CM | POA: Diagnosis present

## 2016-05-12 DIAGNOSIS — Z87891 Personal history of nicotine dependence: Secondary | ICD-10-CM

## 2016-05-12 DIAGNOSIS — F419 Anxiety disorder, unspecified: Secondary | ICD-10-CM | POA: Diagnosis present

## 2016-05-12 DIAGNOSIS — R000 Tachycardia, unspecified: Secondary | ICD-10-CM | POA: Diagnosis present

## 2016-05-12 DIAGNOSIS — E871 Hypo-osmolality and hyponatremia: Secondary | ICD-10-CM | POA: Diagnosis not present

## 2016-05-12 HISTORY — DX: Bipolar disorder, unspecified: F31.9

## 2016-05-12 LAB — CBG MONITORING, ED: Glucose-Capillary: 93 mg/dL (ref 65–99)

## 2016-05-12 MED ORDER — MAGNESIUM HYDROXIDE 400 MG/5ML PO SUSP: 30.0000 mL | Freq: Every day | ORAL | Status: DC | PRN

## 2016-05-12 MED ORDER — IBUPROFEN 600 MG PO TABS: 600.0000 mg | ORAL_TABLET | Freq: Four times a day (QID) | ORAL | Status: DC | PRN

## 2016-05-12 MED ORDER — ASENAPINE MALEATE 5 MG SL SUBL: 5.0000 mg | SUBLINGUAL_TABLET | Freq: Two times a day (BID) | SUBLINGUAL | Status: DC

## 2016-05-12 MED ORDER — CLONAZEPAM 1 MG PO TABS: 1.0000 mg | ORAL_TABLET | Freq: Two times a day (BID) | ORAL | Status: DC

## 2016-05-12 MED ORDER — TRAZODONE HCL 100 MG PO TABS: 100.0000 mg | ORAL_TABLET | Freq: Every day | ORAL | Status: DC

## 2016-05-12 MED ORDER — ALUM & MAG HYDROXIDE-SIMETH 200-200-20 MG/5ML PO SUSP: 30.0000 mL | ORAL | Status: DC | PRN

## 2016-05-12 MED ORDER — LORAZEPAM 2 MG PO TABS
2.0000 mg | ORAL_TABLET | ORAL | Status: DC | PRN
  Administered 2016-05-13: 2 mg via ORAL
  Filled 2016-05-12: qty 1

## 2016-05-12 MED ORDER — OXCARBAZEPINE 150 MG PO TABS
300.0000 mg | ORAL_TABLET | Freq: Two times a day (BID) | ORAL | Status: DC
  Administered 2016-05-12 – 2016-05-14 (×4): 300 mg via ORAL
  Filled 2016-05-12 (×4): qty 2

## 2016-05-12 MED ORDER — ACETAMINOPHEN 325 MG PO TABS
650.0000 mg | ORAL_TABLET | Freq: Four times a day (QID) | ORAL | Status: DC | PRN
  Administered 2016-05-13: 650 mg via ORAL
  Filled 2016-05-12: qty 2

## 2016-05-12 MED ORDER — TEMAZEPAM 15 MG PO CAPS
15.0000 mg | ORAL_CAPSULE | Freq: Every day | ORAL | Status: DC
  Administered 2016-05-12: 15 mg via ORAL
  Filled 2016-05-12: qty 1

## 2016-05-12 MED ORDER — POTASSIUM CHLORIDE 20 MEQ PO PACK
20.0000 meq | PACK | Freq: Every day | ORAL | Status: DC
  Administered 2016-05-12 – 2016-05-14 (×3): 20 meq via ORAL
  Filled 2016-05-12 (×5): qty 1

## 2016-05-12 MED ORDER — ASENAPINE MALEATE 5 MG SL SUBL
10.0000 mg | SUBLINGUAL_TABLET | Freq: Two times a day (BID) | SUBLINGUAL | Status: DC
  Administered 2016-05-12 – 2016-05-14 (×4): 10 mg via SUBLINGUAL
  Filled 2016-05-12 (×5): qty 2

## 2016-05-12 MED ORDER — BACITRACIN-NEOMYCIN-POLYMYXIN 400-5-5000 EX OINT
TOPICAL_OINTMENT | Freq: Two times a day (BID) | CUTANEOUS | Status: DC
  Administered 2016-05-12 – 2016-05-13 (×2): 1 via TOPICAL
  Administered 2016-05-13: 18:00:00 via TOPICAL
  Administered 2016-05-14 – 2016-05-20 (×14): 1 via TOPICAL
  Filled 2016-05-12 (×18): qty 1

## 2016-05-12 MED ORDER — ACETAMINOPHEN 325 MG PO TABS: 650.0000 mg | ORAL_TABLET | Freq: Four times a day (QID) | ORAL | Status: DC | PRN

## 2016-05-12 MED ORDER — INFLUENZA VAC SPLIT QUAD 0.5 ML IM SUSY
0.5000 mL | PREFILLED_SYRINGE | INTRAMUSCULAR | Status: AC
  Administered 2016-05-13: 0.5 mL via INTRAMUSCULAR
  Filled 2016-05-12: qty 0.5

## 2016-05-12 MED ORDER — OXCARBAZEPINE 150 MG PO TABS: 150.0000 mg | ORAL_TABLET | Freq: Two times a day (BID) | ORAL | Status: DC

## 2016-05-12 NOTE — Progress Notes (Signed)
Pt requiring frequent reorientation to room/unit. Mother and brother present for visittation, pt confused and thinks she will be discharging with mother to go home. Pt reoriented and explained length of stay/treatment plan. Cooperative with redirection. Appears confused, and admits "I don't know what I was thinking." Tearful. Safety maintained. Pt taken to her mother and brother awaiting her in the dayroom. Will continue to monitor.

## 2016-05-12 NOTE — BH Assessment (Addendum)
Patient has been [facility] and [facility] Ardyth HarpsHernandez accepting per [name]. Patient will be transported via law enforcement due to being under involuntary commitment. Patients nurse and Dr. [name] and [name], DNP informed of acceptance. Call nurse report to [phone].  Alejandra PokeJoVea Lenoir Facchini, LCSW Therapeutic Triage Specialist [facility] 05/12/2016 9:43 AM
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
## 2016-05-12 NOTE — ED Notes (Signed)
Gatorade given to pt and pt encouraged to increase fluids.

## 2016-05-12 NOTE — BHH Suicide Risk Assessment (Signed)
[facility] Admission Suicide Risk Assessment   Nursing information obtained from:  Patient Demographic factors:  Caucasian, Low socioeconomic status, Unemployed Current Mental Status:  NA (denies SI) Loss Factors:  Financial problems / change in socioeconomic status Historical Factors:  Family history of mental illness or substance abuse, Impulsivity Risk Reduction Factors:  Sense of responsibility to family, Religious beliefs about death, Living with another person, especially a relative, Responsible for children under 29 years of age  Total Time spent with patient: 1 hour Principal Problem: Bipolar affective disorder, current episode manic with psychotic symptoms (HCC) Diagnosis:   Patient Active Problem List   Diagnosis Date Noted  . Hypokalemia [E87.6] 05/12/2016  . Bipolar affective disorder, current episode manic with psychotic symptoms (HCC) [F31.2] 05/09/2016   Subjective Data: manic episode.  Continued Clinical Symptoms:  Alcohol Use Disorder Identification Test Final Score (AUDIT): 0 The "Alcohol Use Disorders Identification Test", Guidelines for Use in Primary Care, Second Edition.  World Science writer [facility]). Score between 0-7:  no or low risk or alcohol related problems. Score between 8-15:  moderate risk of alcohol related problems. Score between 16-19:  high risk of alcohol related problems. Score 20 or above:  warrants further diagnostic evaluation for alcohol dependence and treatment.   CLINICAL FACTORS:   Bipolar Disorder:   Mixed State Currently Psychotic   Musculoskeletal: Strength & Muscle Tone: within normal limits Gait & Station: normal Patient leans: N/A  Psychiatric Specialty Exam: I reviewed physical exam performed in the emergency room and agree with the findings. Physical Exam  Nursing note and vitals reviewed.   Review of Systems  Psychiatric/Behavioral: Positive for hallucinations. The patient has insomnia.   All other systems reviewed and are  negative.   Blood pressure 129/90, pulse (!) 113, temperature 97.2 F (36.2 C), temperature source Oral, resp. rate 20, height 5\' 6"  (1.676 m), weight 55.8 kg (123 lb), SpO2 100 %.Body mass index is 19.85 kg/m.  General Appearance: Casual  Eye Contact:  Good  Speech:  Pressured  Volume:  Increased  Mood:  Euphoric  Affect:  Congruent  Thought Process:  Disorganized and Descriptions of Associations: Tangential  Orientation:  Full (Time, Place, and Person)  Thought Content:  Delusions, Hallucinations: Auditory and Paranoid Ideation  Suicidal Thoughts:  No  Homicidal Thoughts:  No  Memory:  Immediate;   Fair Recent;   Fair Remote;   Fair  Judgement:  Poor  Insight:  Lacking  Psychomotor Activity:  Increased  Concentration:  Concentration: Fair and Attention Span: Fair  Recall:  [employer] of Knowledge:  Fair  Language:  Fair  Akathisia:  No  Handed:  Right  AIMS (if indicated):     Assets:  Communication Skills Desire for Improvement Financial Resources/Insurance Housing Physical Health Resilience Social Support Transportation Vocational/Educational  ADL's:  Intact  Cognition:  WNL  Sleep:         COGNITIVE FEATURES THAT CONTRIBUTE TO RISK:  None    SUICIDE RISK:   Moderate:  Frequent suicidal ideation with limited intensity, and duration, some specificity in terms of plans, no associated intent, good self-control, limited dysphoria/symptomatology, some risk factors present, and identifiable protective factors, including available and accessible social support.   PLAN OF CARE: He had admission, medication management, discharge planning.  Alejandra Stevenson is a 30 year old female with no past psychiatric history admitted floridly psychotic in a manic episode.  1. Agitation. The patient was initially 4 point restraints in the emergency room. This has resolved.   2.  Mood and psychosis. She was started on a combination of Saphris and Trileptal.  3. Hypokalemia. We will  continue KCl and recheck potassium level in the morning.  4. Insomnia. Restoril is available.  5. Metabolic syndrome monitoring. Lipid panel, TSH, hemoglobin A1c are pending.  6. EKG.  7. Disposition. She will be discharged to home with her husband. She will follow up with [facility].  I certify that inpatient services furnished can reasonably be expected to improve the patient's condition.  Alejandra LineaJolanta Jaelle Campanile, MD 05/12/2016, 3:02 PM

## 2016-05-12 NOTE — ED Notes (Signed)
Pt up to rest room and reported to writer that she will not be taking those medications tomorrow/

## 2016-05-12 NOTE — Tx Team (Signed)
Initial Treatment Plan 05/12/2016 1:45 PM Alejandra Stevenson [id number]    PATIENT STRESSORS: Educational concerns Financial difficulties Marital or family conflict Occupational concerns   PATIENT STRENGTHS: Active sense of humor Capable of independent living Communication skills General fund of knowledge Motivation for treatment/growth Physical Health Religious Affiliation Supportive family/friends   PATIENT IDENTIFIED PROBLEMS:   "I know I'm pregnant because God told me I am eben though I know I"m not"    "i've been having financial difficulties."    "I want to get a job"           DISCHARGE CRITERIA:  Ability to meet basic life and health needs Adequate post-discharge living arrangements Improved stabilization in mood, thinking, and/or behavior Motivation to continue treatment in a less acute level of care Verbal commitment to aftercare and medication compliance  PRELIMINARY DISCHARGE PLAN: Attend aftercare/continuing care group Outpatient therapy Participate in family therapy Return to previous living arrangement  PATIENT/FAMILY INVOLVEMENT: This treatment plan has been presented to and reviewed with the patient, Alejandra Stevenson, and/or family member, .  The patient and family have been given the opportunity to ask questions and make suggestions.  Alejandra PearsonAmanda N Helaman Mecca, RN 05/12/2016, 1:45 PM
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
## 2016-05-12 NOTE — ED Notes (Signed)
Pt approached Clinical research associatewriter and stated that she has "heard from the lord that I am pregnant" and also reported that when she urinated she saw pink and blue in the water, that means a boy and a girl." Clinical research associatewriter prompted pt to come get a nurse the next time she sees colors in the toilet.
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
## 2016-05-12 NOTE — BHH Group Notes (Signed)
BHH LCSW Group Therapy  05/12/2016 3:12 PM  Type of Therapy:  Group Therapy  Participation Level:  Active  Participation Quality:  Monopolizing, Redirectable and Sharing  Affect:  Anxious  Cognitive:  Alert  Insight:  Limited and Off Topic  Engagement in Therapy:  Engaged  Modes of Intervention:  Activity, discussion, education, support  Summary of Progress/Problems: .Coping Skills: Patients defined and discussed healthy coping skills. Patients identified healthy coping skills they would like to try during hospitalization and after discharge. CSW offered insight to varying coping skills that may have been new to patients such as practicing mindfulness.  Mairim Bade G. Garnette CzechSampson MSW, LCSWA 05/12/2016, 3:15 PM

## 2016-05-12 NOTE — ED Notes (Signed)
Pt talking on the phone when the power is off.
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
## 2016-05-12 NOTE — ED Notes (Signed)
Pt is moderately anxious, but able to be calm and cooperative. She took her medications and understands that she needs to take her medications to clear her delusions. She seems to understand that she is having delusional thinking. She said that her father has a history of Bipolar and she thinks that she inherited "some" of his traits. She sometimes continues to think that she is pregnant, but is able to verbalize after redirection that she knows that she is not pregnant. She continues to pray for improvement and is interesting in returning to her family.She is eating, drinking, taking care of her hygiene and toileting herself appropriately.

## 2016-05-12 NOTE — Progress Notes (Signed)
Patient does not have insurance and will most likely need to be changed from Saphris to a less expensive antipsychotic.    [name], PMH-NP

## 2016-05-13 LAB — TSH: TSH: 1.622 u[IU]/mL (ref 0.350–4.500)

## 2016-05-13 LAB — LIPID PANEL
Cholesterol: 177 mg/dL (ref 0–200)
HDL: 59 mg/dL (ref >40)
LDL CALC: 105 mg/dL — AB (ref 0–99)
Total CHOL/HDL Ratio: 3 RATIO
Triglycerides: 63 mg/dL (ref <150)
VLDL: 13 mg/dL (ref 0–40)

## 2016-05-13 LAB — POTASSIUM: POTASSIUM: 3.9 mmol/L (ref 3.5–5.1)

## 2016-05-13 MED ORDER — TEMAZEPAM 15 MG PO CAPS
30.0000 mg | ORAL_CAPSULE | Freq: Every day | ORAL | Status: DC
  Administered 2016-05-13: 30 mg via ORAL
  Filled 2016-05-13: qty 2

## 2016-05-13 MED ORDER — CHLORPROMAZINE HCL 50 MG PO TABS
50.0000 mg | ORAL_TABLET | Freq: Once | ORAL | Status: AC
  Administered 2016-05-13: 50 mg via ORAL
  Filled 2016-05-13: qty 1

## 2016-05-13 MED ORDER — CHLORPROMAZINE HCL 10 MG PO TABS
50.0000 mg | ORAL_TABLET | Freq: Three times a day (TID) | ORAL | Status: DC | PRN
  Filled 2016-05-13: qty 5

## 2016-05-13 MED ORDER — LORAZEPAM 1 MG PO TABS
1.0000 mg | ORAL_TABLET | Freq: Three times a day (TID) | ORAL | Status: DC
Start: 2016-05-13 — End: 2016-05-14
  Administered 2016-05-13 – 2016-05-14 (×3): 1 mg via ORAL
  Filled 2016-05-13 (×3): qty 1

## 2016-05-13 NOTE — BHH Counselor (Signed)
Patient is delusional, disorganized, religiously preoccupied stating things like "I need ear plugs to hear jesus better". At this time, patient is unable to participate in the Psychosocial assessment at this time. CSW assigned to patient will reassess tomorrow to see if patient is appropriate for PSA.   Jhonathan Desroches G. Garnette CzechSampson MSW, LCSWA 05/13/2016 10:15 AM

## 2016-05-13 NOTE — Plan of Care (Signed)
Problem: Health Behavior/Discharge Planning: Goal: Compliance with prescribed medication regimen will improve Outcome: Progressing Pt has been compliant with prescribed medications.   

## 2016-05-13 NOTE — Progress Notes (Signed)
Pt requires frequent reorientation to get back to her room. Hyper religious and restless. Stated to Clinical research associatewriter " I need to talk to you, If you deny God, he will deny you. I had to get that off my chest". Stated that she thinks she is dealing with ADHD after being around her pastors wife at a Retreat. Moderate confusion noted.  Medication compliant.  Encouragement and support provided. q15 minute checks maintained for safety. Medications given as prescribed. Remains safe on unit. Disorganized. Will continue to monitor.

## 2016-05-13 NOTE — BHH Group Notes (Signed)
BHH LCSW Group Therapy  05/13/2016 3:11 PM  Type of Therapy:  Group Therapy  Participation Level:  None, patient came outside but did not join the group. Patient remained outside but decided to sit by milieu staff.   Summary of Progress/Problems: Communications: Patients identify how individuals communicate with one another appropriately and inappropriately. Patients will be guided to discuss their thoughts, feelings, and behaviors related to barriers when communicating. The group will process together ways to execute positive and appropriate communications.   Shuree Brossart G. Garnette CzechSampson MSW, LCSWA 05/13/2016, 3:14 PM

## 2016-05-13 NOTE — Progress Notes (Signed)
Pt has been pleasant and cooperative but psychotic and disorganized. Pt needs frequent redirecting.Pt has been compliant with taking po meds. Pt denies SI but endorses hearing Jesus voice.Pt has been active on the and some what intrusive.

## 2016-05-13 NOTE — Progress Notes (Addendum)
[facility] MD Progress Note  05/13/2016 11:32 AM Alejandra Stevenson  MRN:  [id number]  Subjective:  Alejandra Stevenson is still very psychotic and disorganized in her thinking. She no longer believes she is pregnant and reports voices going away. One minute she wants ear drops to "hear Jesus Better", another she wants ear plugs. She did not sleep well last night and feels very anxious. No somatic complaints. Tolerates medications well.  Principal Problem: Bipolar affective disorder, current episode manic with psychotic symptoms (HCC) Diagnosis:   Patient Active Problem List   Diagnosis Date Noted  . Hypokalemia [E87.6] 05/12/2016  . Bipolar affective disorder, current episode manic with psychotic symptoms (HCC) [F31.2] 05/09/2016   Total Time spent with patient: 20 minutes  Past Psychiatric History: None.  Past Medical History:  Past Medical History:  Diagnosis Date  . Anemia   . Headache    History reviewed. No pertinent surgical history. Family History: History reviewed. No pertinent family history. Family Psychiatric  History: Father with bipolar. Social History:  History  Alcohol Use No     History  Drug Use No    Social History   Social History  . Marital status: Married    Spouse name: N/A  . Number of children: N/A  . Years of education: N/A   Social History Main Topics  . Smoking status: Former Smoker    Packs/day: 0.50    Types: Cigarettes  . Smokeless tobacco: Never Used  . Alcohol use No  . Drug use: No  . Sexual activity: Yes    Birth control/ protection: None     Comment: on menstrual period   Other Topics Concern  . None   Social History Narrative  . None   Additional Social History:    History of alcohol / drug use?: No history of alcohol / drug abuse                    Sleep: Poor  Appetite:  Fair  Current Medications: Current Facility-Administered Medications  Medication Dose Route Frequency Provider Last Rate Last Dose  . acetaminophen  (TYLENOL) tablet 650 mg  650 mg Oral Q6H PRN Shari Prows, MD   650 mg at 05/13/16 0425  . alum & mag hydroxide-simeth (MAALOX/MYLANTA) 200-200-20 MG/5ML suspension 30 mL  30 mL Oral Q4H PRN Kagen Kunath B Starletta Houchin, MD      . asenapine (SAPHRIS) sublingual tablet 10 mg  10 mg Sublingual BID Shari Prows, MD   10 mg at 05/13/16 0911  . ibuprofen (ADVIL,MOTRIN) tablet 600 mg  600 mg Oral Q6H PRN Charm Rings, NP      . LORazepam (ATIVAN) tablet 2 mg  2 mg Oral Q4H PRN Shari Prows, MD   2 mg at 05/13/16 0213  . magnesium hydroxide (MILK OF MAGNESIA) suspension 30 mL  30 mL Oral Daily PRN Charm Rings, NP      . neomycin-bacitracin-polymyxin (NEOSPORIN) ointment   Topical BID Charm Rings, NP   1 application at 05/13/16 0911  . OXcarbazepine (TRILEPTAL) tablet 300 mg  300 mg Oral BID Shari Prows, MD   300 mg at 05/13/16 0910  . potassium chloride (KLOR-CON) packet 20 mEq  20 mEq Oral Daily Shari Prows, MD   20 mEq at 05/13/16 0910  . temazepam (RESTORIL) capsule 15 mg  15 mg Oral QHS Shari Prows, MD   15 mg at 05/12/16 2140    Lab Results:  Results for  orders placed or performed during the hospital encounter of 05/12/16 (from the past 48 hour(s))  TSH     Status: None   Collection Time: 05/13/16  6:32 AM  Result Value Ref Range   TSH 1.622 0.350 - 4.500 uIU/mL    Comment: Performed by a 3rd Generation assay with a functional sensitivity of <=0.01 uIU/mL.  Lipid panel     Status: Abnormal   Collection Time: 05/13/16  6:32 AM  Result Value Ref Range   Cholesterol 177 0 - 200 mg/dL   Triglycerides 63 <161 mg/dL   HDL 59 >09 mg/dL   Total CHOL/HDL Ratio 3.0 RATIO   VLDL 13 0 - 40 mg/dL   LDL Cholesterol 604 (H) 0 - 99 mg/dL    Comment:        Total Cholesterol/HDL:CHD Risk Coronary Heart Disease Risk Table                     Men   Women  1/2 Average Risk   3.4   3.3  Average Risk       5.0   4.4  2 X Average Risk   9.6   7.1  3 X  Average Risk  23.4   11.0        Use the calculated Patient Ratio above and the CHD Risk Table to determine the patient's CHD Risk.        ATP III CLASSIFICATION (LDL):  <100     mg/dL   Optimal  540-981  mg/dL   Near or Above                    Optimal  130-159  mg/dL   Borderline  191-478  mg/dL   High  >295     mg/dL   Very High   Potassium     Status: None   Collection Time: 05/13/16  6:32 AM  Result Value Ref Range   Potassium 3.9 3.5 - 5.1 mmol/L    Blood Alcohol level:  Lab Results  Component Value Date   ETH <5 05/07/2016    Metabolic Disorder Labs: No results found for: HGBA1C, MPG No results found for: PROLACTIN Lab Results  Component Value Date   CHOL 177 05/13/2016   TRIG 63 05/13/2016   HDL 59 05/13/2016   CHOLHDL 3.0 05/13/2016   VLDL 13 05/13/2016   LDLCALC 105 (H) 05/13/2016    Physical Findings: AIMS: Facial and Oral Movements Muscles of Facial Expression: None, normal Lips and Perioral Area: None, normal Jaw: None, normal Tongue: None, normal,Extremity Movements Upper (arms, wrists, hands, fingers): None, normal Lower (legs, knees, ankles, toes): None, normal, Trunk Movements Neck, shoulders, hips: None, normal, Overall Severity Severity of abnormal movements (highest score from questions above): None, normal Incapacitation due to abnormal movements: None, normal Patient's awareness of abnormal movements (rate only patient's report): No Awareness, Dental Status Current problems with teeth and/or dentures?: No Does patient usually wear dentures?: No  CIWA:    COWS:     Musculoskeletal: Strength & Muscle Tone: within normal limits Gait & Station: normal Patient leans: N/A  Psychiatric Specialty Exam: Physical Exam  Nursing note and vitals reviewed.   Review of Systems  Psychiatric/Behavioral: Positive for hallucinations. The patient is nervous/anxious and has insomnia.   All other systems reviewed and are negative.   Blood pressure  (!) 125/95, pulse (!) 115, temperature 97.2 F (36.2 C), temperature source Oral, resp. rate 20, height 5\' 6"  (  1.676 m), weight 55.8 kg (123 lb), SpO2 100 %.Body mass index is 19.85 kg/m.  General Appearance: Casual  Eye Contact:  Good  Speech:  Pressured  Volume:  Normal  Mood:  Anxious and Dysphoric  Affect:  Congruent  Thought Process:  Goal Directed and Descriptions of Associations: Tangential  Orientation:  Full (Time, Place, and Person)  Thought Content:  Delusions, Hallucinations: Auditory and Paranoid Ideation  Suicidal Thoughts:  No  Homicidal Thoughts:  No  Memory:  Immediate;   Fair Recent;   Fair Remote;   Fair  Judgement:  Poor  Insight:  Lacking  Psychomotor Activity:  Normal  Concentration:  Concentration: Fair and Attention Span: Fair  Recall:  [employer]  Fund of Knowledge:  Fair  Language:  Fair  Akathisia:  No  Handed:  Right  AIMS (if indicated):     Assets:  Communication Skills Desire for Improvement Housing Intimacy Physical Health Resilience Social Support Transportation  ADL's:  Intact  Cognition:  WNL  Sleep:        Treatment Plan Summary: Daily contact with patient to assess and evaluate symptoms and progress in treatment and Medication management   Ms. Janina MayoWilkes is a 30 year old female with no past psychiatric history admitted floridly psychotic in a manic episode.  1. Agitation. The patient was initially 4 point restraints in the emergency room. This has resolved.   2. Mood and psychosis. She was started on a combination of Saphris and Trileptal.  3. Hypokalemia. We continue KCl. Potassium 3.9 on 10/22.   4. Insomnia. Will increase Restoril to 30 mg.   5. Metabolic syndrome monitoring. Lipid panel, TSH, are normal, hemoglobin A1c are pending.  6. EKG. Sinus tachycardia. QT 312.  7. Anxiety. Will give standing Ativan in the hospital.  8. Disposition. She will be discharged to home with her husband. She will follow up with [facility].  Kristine LineaJolanta Nima Bamburg, MD 05/13/2016, 11:32 AM

## 2016-05-14 LAB — HEMOGLOBIN A1C
Hgb A1c MFr Bld: 4.8 % (ref 4.8–5.6)
MEAN PLASMA GLUCOSE: 91 mg/dL

## 2016-05-14 MED ORDER — LORAZEPAM 1 MG PO TABS
1.0000 mg | ORAL_TABLET | Freq: Once | ORAL | Status: AC
  Administered 2016-05-14: 1 mg via ORAL
  Filled 2016-05-14: qty 1

## 2016-05-14 MED ORDER — DIPHENHYDRAMINE HCL 25 MG PO CAPS
25.0000 mg | ORAL_CAPSULE | Freq: Every day | ORAL | Status: DC
  Administered 2016-05-14: 25 mg via ORAL
  Filled 2016-05-14: qty 1

## 2016-05-14 MED ORDER — LITHIUM CARBONATE ER 450 MG PO TBCR
450.0000 mg | EXTENDED_RELEASE_TABLET | Freq: Two times a day (BID) | ORAL | Status: DC
  Administered 2016-05-14 – 2016-05-21 (×14): 450 mg via ORAL
  Filled 2016-05-14 (×14): qty 1

## 2016-05-14 MED ORDER — ZIPRASIDONE HCL 40 MG PO CAPS
40.0000 mg | ORAL_CAPSULE | Freq: Two times a day (BID) | ORAL | Status: DC
  Administered 2016-05-14 – 2016-05-15 (×2): 40 mg via ORAL
  Filled 2016-05-14 (×2): qty 1

## 2016-05-14 MED ORDER — ENSURE ENLIVE PO LIQD
237.0000 mL | Freq: Two times a day (BID) | ORAL | Status: DC
  Administered 2016-05-14 – 2016-05-21 (×14): 237 mL via ORAL

## 2016-05-14 MED ORDER — LORAZEPAM 1 MG PO TABS
1.0000 mg | ORAL_TABLET | Freq: Three times a day (TID) | ORAL | Status: DC | PRN
  Administered 2016-05-15 – 2016-05-19 (×4): 1 mg via ORAL
  Filled 2016-05-14 (×4): qty 1

## 2016-05-14 MED ORDER — LORAZEPAM 1 MG PO TABS: 1.0000 mg | ORAL_TABLET | Freq: Three times a day (TID) | ORAL | Status: DC | PRN

## 2016-05-14 MED ORDER — LORAZEPAM 2 MG PO TABS
2.0000 mg | ORAL_TABLET | Freq: Every day | ORAL | Status: DC
  Administered 2016-05-14 – 2016-05-17 (×4): 2 mg via ORAL
  Filled 2016-05-14 (×4): qty 1

## 2016-05-14 NOTE — BHH Group Notes (Signed)
B[facility] Group Notes:  (Nursing/MHT/Case Management/Adjunct)  Date:  05/14/2016  Time:  1:41 AM  Type of Therapy:  Group Therapy  Participation Level:  Did Not Attend    Alejandra Stevenson 05/14/2016, 1:41 AM
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
## 2016-05-14 NOTE — Progress Notes (Signed)
Recreation Therapy Notes  Date: 10.23.17 Time: 9:30 am Location: Craft Room  Group Topic: Self-expression  Goal Area(s) Addresses:  Patient will effectively use art as a means of self-expression. Patient will recognize positive benefit of self-expression. Patient will be able to identify one emotion experienced during group. Patient will identify use of art as a coping skill.  Behavioral Response: Attentive, Interactive  Intervention: Two Faces of Me  Activity: Patients were given blank face worksheet and were instructed to draw a line down the middle. On one side they were instructed to draw or write how they felt when they were admitted to the hospital, and on the other side they were instructed to draw or write how they want to feel when they are d/c.  Education: LRT educated patients on different forms of self-expression.  Education Outcome: Acknowledges education/In group clarification offered   Clinical Observations/Feedback: Patient drew how she felt when she was admitted to the hospital and how she wanted to feel when she is d/c. Patient contributed to group discussion by stating how the sides of her worksheet were different, what she has learned since being in the hospital, and what emotions she felt during group.  Jacquelynn CreeGreene,Rangel Echeverri M, LRT/CTRS 05/14/2016 10:38 AM

## 2016-05-14 NOTE — Progress Notes (Signed)
D: Pt denies SI/HI/AVH, but noted responding to internal stimuli. Patient is very intrusive, asking bizarre questions, ruminating on different topics,  and appears paranoid. Pt is cooperative with treatment plan. Patient is not interacting with peers and staff appropriately.  A: Pt was offered support and encouragement. Pt was given scheduled medications. Pt was encouraged to attend groups. Q 15 minute checks were done for safety.  R:Pt did not attends group. Pt is complaint with  medication. Pt receptive to treatment and safety maintained on unit.

## 2016-05-14 NOTE — Progress Notes (Signed)
NUTRITION ASSESSMENT  Pt identified as at risk on the Malnutrition Screen Tool  INTERVENTION: 1. Educated patient on the importance of nutrition and encouraged intake of food and beverages. 2. Discussed weight goals. 3. Supplements: Ensure Enlive po BID, each supplement provides 350 kcal and 20 grams of protein  NUTRITION DIAGNOSIS: Unintentional weight loss related to sub-optimal intake as evidenced by pt report.   Goal: Pt to meet >/= 90% of their estimated nutrition needs.  Monitor:  PO intake  Assessment:  Alejandra Stevenson is a  30 y.o. female who has been hearing voices, been hyper-religious, presents with bipolar affective disordered with manic psychotic symptoms. She states that her PO intake was  Good PTA, but that she hasn't been eating as much since she has been hospitalized. Denies wt loss PTA, but states she may have lost a few pounds during stay. She also asked for ensure previously. No acute complaints of nausea/vomiting or issues chewing/choking/swallowing. Meal completion documented at 10-50-100%  Height: Ht Readings from Last 1 Encounters:  05/12/16 5\' 6"  (1.676 m)    Weight: Wt Readings from Last 1 Encounters:  05/12/16 123 lb (55.8 kg)    Weight Hx: Wt Readings from Last 10 Encounters:  05/12/16 123 lb (55.8 kg)  05/07/16 130 lb (59 kg)  10/29/14 130 lb (59 kg)  07/22/12 114 lb (51.7 kg)    BMI:  Body mass index is 19.85 kg/m. Pt meets criteria for normal weight based on current BMI.  Estimated Nutritional Needs: Kcal: 25-30 kcal/kg Protein: > 1 gram protein/kg Fluid: 1 ml/kcal  Diet Order: Diet Heart Room service appropriate? Yes; Fluid consistency: Thin Pt is also offered choice of unit snacks mid-morning and mid-afternoon.  Pt is eating as desired.   Lab results and medications reviewed.   Dionne AnoWilliam M. Ilynn Stauffer, MS, RD LDN Inpatient Clinical Dietitian Pager [phone]

## 2016-05-14 NOTE — Progress Notes (Signed)
D:emains  Disorganized, tangential .   Unable to find her room on the unit . Attending unit programing Appetite  Fair  Appropriate ADL  No auditory hallucinations  No pain concerns . Appropriate ADL'S. Interacting with peers and staff.  A: Encourage patient participation with unit programming . Instruction  Given on  Medication , verbalize understanding. R: Voice no other concerns. Staff continue to monitor

## 2016-05-15 MED ORDER — DIPHENHYDRAMINE HCL 25 MG PO CAPS
50.0000 mg | ORAL_CAPSULE | Freq: Every day | ORAL | Status: DC
  Administered 2016-05-15 – 2016-05-17 (×3): 50 mg via ORAL
  Filled 2016-05-15 (×3): qty 2

## 2016-05-15 MED ORDER — ZIPRASIDONE HCL 40 MG PO CAPS
80.0000 mg | ORAL_CAPSULE | Freq: Two times a day (BID) | ORAL | Status: DC
  Administered 2016-05-15 – 2016-05-21 (×12): 80 mg via ORAL
  Filled 2016-05-15 (×5): qty 2
  Filled 2016-05-15: qty 1
  Filled 2016-05-15 (×7): qty 2

## 2016-05-15 MED ORDER — METOPROLOL TARTRATE 25 MG PO TABS
12.5000 mg | ORAL_TABLET | Freq: Two times a day (BID) | ORAL | Status: DC
  Administered 2016-05-15 – 2016-05-16 (×2): 12.5 mg via ORAL
  Filled 2016-05-15 (×2): qty 1

## 2016-05-15 MED ORDER — ZIPRASIDONE HCL 40 MG PO CAPS: 60.0000 mg | ORAL_CAPSULE | Freq: Two times a day (BID) | ORAL | Status: DC

## 2016-05-15 NOTE — [facility] Group Notes (Signed)
[facility] Group Notes:  (Nursing/MHT/Case Management/Adjunct)  Date:  05/15/2016  Time:  11:00 PM  Type of Therapy:  Psychoeducational Skills  Participation Level:  Did Not Attend  Participation Quality:   Summary of Progress/Problems:  Alejandra Stevenson 05/15/2016, 11:00 PM
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
## 2016-05-15 NOTE — [facility] Group Notes (Signed)
[facility] Group Notes:  (Nursing/MHT/Case Management/Adjunct)  Date:  05/15/2016  Time:  5:04 PM  Type of Therapy:  Psychoeducational Skills  Participation Level:  Active  Participation Quality:  Appropriate  Affect:  Appropriate and Not Congruent  Cognitive:  Disorganized  Insight:  Limited  Engagement in Group:  Poor  Modes of Intervention:  Discussion, Education and Support  Summary of Progress/Problems:  Lynelle SmokeCara Travis [facility] Center[facility]oni 05/15/2016, 5:04 PM

## 2016-05-15 NOTE — Progress Notes (Signed)
D:Remains  Disorganized, tangential .   Unable to find her room on the unit . Attending unit programing Appetite  Fair  Appropriate ADL  No auditory hallucinations  No pain concerns . Appropriate ADL'S. Interacting with peers and staff Patient  approaching other peers telling them that she is their wife or sister . Patient thought all shift that she was going home . Noted to tote a paper bag around . A: Encourage patient participation with unit programming . Instruction  Given on  Medication , verbalize understanding. R: Voice no other concerns. Staff continue to monitor

## 2016-05-15 NOTE — Progress Notes (Signed)
Observed resting in her room, spontaneous eyes opening on prompt, VSS except for elevated heart rate, patient noted to have dry, scaly lips and questionably dehydrated, Gatorade provided, fluid intake encouraged, Ativan 2 mg given at bedtime; will monitor.
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
## 2016-05-15 NOTE — Progress Notes (Signed)
Pts pulse rate was reported to be 146 bpm and 153 bpm using Dinamap machine. It was 121 manually. This seems to be pts baseline as it has been over 100 for the last few days. Is asymptomatic. Will report to oncoming shift.

## 2016-05-15 NOTE — Progress Notes (Signed)
Recreation Therapy Notes  Date: 10.24.17 Time: 9:30 am Location: Craft Room  Group Topic: Goal Setting  Goal Area(s) Addresses:  Patient will write at least one goal. Patient will write at least one obstacle.  Behavioral Response: Attentive, Left early  Intervention: Recovery Goal Chart  Activity: Patients were instructed to make a Recovery Goal Chart including goals, obstacles, the date they started working on their goal, and the date they achieved their goal.  Education: LRT educated patients on healthy ways to celebrate reaching their goals.  Education Outcome: Patient left before LRT educated group.  Clinical Observations/Feedback: Patient wrote goals and obstacles. Patient left group at approximately 9:50 am. Patient did not return to group.  Jacquelynn CreeGreene,Aalliyah Kilker M, LRT/CTRS 05/15/2016 10:06 AM

## 2016-05-15 NOTE — [facility] Counselor (Signed)
Adult Comprehensive Assessment  Patient ID: Alejandra Stevenson, female   DOB: 08/30/1985, 30 y.o.   MRN: [id number]  Information Source: Information source: Patient  Current Stressors:  Family Relationships: supportive husband Social relationships: supportive church family  Living/Environment/Situation:  Living Arrangements: Spouse/significant other, Children Living conditions (as described by patient or guardian): Safe supportive How long has patient lived in current situation?: 8 years What is atmosphere in current home: Comfortable, ParamedicLoving, Supportive  Family History:  Marital status: Married Number of Years Married: 8 What types of issues is patient dealing with in the relationship?: husband is a Optician, dispensingminister Are you sexually active?: Yes What is your sexual orientation?: heterosexual Has your sexual activity been affected by drugs, alcohol, medication, or emotional stress?: no Does patient have children?: Yes How many children?: 1 How is patient's relationship with their children?: 32 year old son and says she is currently pregnant, this new pregnancy is a delusion I believe.  Childhood History:  By whom was/is the patient raised?: Both parents Patient's description of current relationship with people who raised him/her: Good relationships Does patient have siblings?: Yes Number of Siblings: 1 Description of patient's current relationship with siblings: brother Did patient suffer any verbal/emotional/physical/sexual abuse as a child?: No Did patient suffer from severe childhood neglect?: No Has patient ever been sexually abused/assaulted/raped as an adolescent or adult?: No Was the patient ever a victim of a crime or a disaster?: No Witnessed domestic violence?: No Has patient been effected by domestic violence as an adult?: No  Education:  Currently a Consulting civil engineerstudent?: No Learning disability?: No  Employment/Work Situation:   Employment situation: Unemployed (Stays home with her  son) Patient's job has been impacted by current illness: No Has patient ever been in the [employer] and Companymilitary?: No Has patient ever served in combat?: No Did You Receive Any Psychiatric Treatment/Services While in Equities traderthe Military?: No Are There Guns or Other Weapons in Your Home?: No  Financial Resources:   Financial resources: Income from spouse Does patient have a representative payee or guardian?: No  Alcohol/Substance Abuse:   What has been your use of drugs/alcohol within the last 12 months?: Pt denies any SA If attempted suicide, did drugs/alcohol play a role in this?: No Alcohol/Substance Abuse Treatment Hx: Denies past history Has alcohol/substance abuse ever caused legal problems?: No  Social Support System:   Conservation officer, natureatient's Community Support System: Fair Describe Community Support System: "Church family" Type of faith/religion: Ephriam KnucklesChristian How does patient's faith help to cope with current illness?: Hope, support  Leisure/Recreation:   Leisure and Hobbies: taking son to park and on outings  Strengths/Needs:   What things does the patient do well?: parenting, mother  Discharge Plan:   Does patient have access to transportation?: Yes Will patient be returning to same living situation after discharge?: Yes Currently receiving community mental health services: No If no, would patient like referral for services when discharged?: Yes (What [place]?) ([place], [facility] [place] Does patient have financial barriers related to discharge medications?: No  Summary/Recommendations:   Summary and Recommendations (to be completed by the evaluator): Pt is a 30 year old female who was brought to the Emergency room by her husband. She was manic and hyper-religious.  While on the unit she will have the opportunity to participate in groups and therapeutic milieu. She will have medications managed and assistance with discharge planning.  Recommendations include outpatient medication management and counseling  to continue progress made inpatient.  Glennon MacSara P [place], MSW, LCSW 05/15/2016
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
## 2016-05-15 NOTE — Plan of Care (Signed)
Problem: Safety: Goal: Ability to remain free from injury will improve Outcome: Progressing Attending  Groups , staff aglert to patient presents's  On unit  With redirection for safety

## 2016-05-16 ENCOUNTER — Encounter: Payer: Self-pay | Admitting: Internal Medicine

## 2016-05-16 LAB — CBC
HEMATOCRIT: 43.7 % (ref 35.0–47.0)
HEMOGLOBIN: 14.4 g/dL (ref 12.0–16.0)
MCH: 28.7 pg (ref 26.0–34.0)
MCHC: 33 g/dL (ref 32.0–36.0)
MCV: 86.9 fL (ref 80.0–100.0)
Platelets: 352 10*3/uL (ref 150–440)
RBC: 5.03 MIL/uL (ref 3.80–5.20)
RDW: 13.1 % (ref 11.5–14.5)
WBC: 16.8 10*3/uL — ABNORMAL HIGH (ref 3.6–11.0)

## 2016-05-16 LAB — BASIC METABOLIC PANEL
ANION GAP: 7 (ref 5–15)
BUN: 12 mg/dL (ref 6–20)
CALCIUM: 9.5 mg/dL (ref 8.9–10.3)
CHLORIDE: 92 mmol/L — AB (ref 101–111)
CO2: 32 mmol/L (ref 22–32)
CREATININE: 0.79 mg/dL (ref 0.44–1.00)
GFR calc non Af Amer: >60 mL/min (ref >60)
GLUCOSE: 77 mg/dL (ref 65–99)
Potassium: 4.7 mmol/L (ref 3.5–5.1)
Sodium: 131 mmol/L — ABNORMAL LOW (ref 135–145)

## 2016-05-16 MED ORDER — METOPROLOL TARTRATE 25 MG PO TABS
12.5000 mg | ORAL_TABLET | Freq: Once | ORAL | Status: AC
  Administered 2016-05-16: 12.5 mg via ORAL
  Filled 2016-05-16: qty 1

## 2016-05-16 MED ORDER — METOPROLOL TARTRATE 25 MG PO TABS
25.0000 mg | ORAL_TABLET | Freq: Two times a day (BID) | ORAL | Status: DC
  Administered 2016-05-16 – 2016-05-21 (×10): 25 mg via ORAL
  Filled 2016-05-16 (×11): qty 1

## 2016-05-16 MED ORDER — METOPROLOL TARTRATE 25 MG PO TABS: 25.0000 mg | ORAL_TABLET | Freq: Two times a day (BID) | ORAL | Status: DC

## 2016-05-16 NOTE — Plan of Care (Signed)
Problem: Safety: Goal: Ability to remain free from injury will improve Outcome: Progressing Ativan 1 mg PO PRN given for Anxiety and elevated HR, 15 minute checks maintained for safety, sporadic confusion and screaming out loud twice noted, patient denied screaming on assessment; clinical and moral support provided, patient encouraged to continue to express feelings and demonstrate safe care. Patient remains free from harm, will continue to monitor.

## 2016-05-16 NOTE — Progress Notes (Signed)
Well groomed, less confused, visible in the milieu, denied SI/HI, denied AV/H, HR=101, BP=109/73, less anxious, fluid encouraged, compliant with medications.

## 2016-05-16 NOTE — Plan of Care (Signed)
Problem: Education: Goal: Mental status will improve Outcome: Progressing Pt logical and coherent. Denies AVH, SI, HI.

## 2016-05-16 NOTE — [facility] Group Notes (Signed)
[facility] LCSW Group Therapy   05/15/2016  1pm   Type of Therapy: Group Therapy   Participation Level: Did Not Attend. Patient invited to participate but declined.    Dorothe PeaJonathan F. Skila Rollins, MSW, LCSWA, LCAS

## 2016-05-16 NOTE — [facility] Suicide Risk Assessment (Signed)
[facility] INPATIENT:  Family/Significant Other Suicide Prevention Education  Suicide Prevention Education:  Education Completed; Alejandra PotterJames Gorsline, [phone],  (name of family member/significant other) has been identified by the patient as the family member/significant other with whom the patient will be residing, and identified as the person(s) who will aid the patient in the event of a mental health crisis (suicidal ideations/suicide attempt).  With written consent from the patient, the family member/significant other has been provided the following suicide prevention education, prior to the and/or following the discharge of the patient.  The suicide prevention education provided includes the following:  Suicide risk factors  Suicide prevention and interventions  National Suicide Hotline telephone number  [facility] assessment telephone number  [facility] 911  [facility] and/or Residential Mobile Crisis Unit telephone number  Request made of family/significant other to:  Remove weapons (e.g., guns, rifles, knives), all items previously/currently identified as safety concern.    Remove drugs/medications (over-the-counter, prescriptions, illicit drugs), all items previously/currently identified as a safety concern.  The family member/significant other verbalizes understanding of the suicide prevention education information provided.  The family member/significant other agrees to remove the items of safety concern listed above.  Glennon MacSara P Feven Alderfer, MSW, LCSW 05/16/2016, 4:22 PM

## 2016-05-16 NOTE — Consult Note (Signed)
[facility]   PATIENT NAME: Alejandra Stevenson    MR#:  [id number]  DATE OF BIRTH:  June 09, 1986  DATE OF ADMISSION:  05/12/2016  PRIMARY CARE PHYSICIAN: None  REQUESTING/REFERRING PHYSICIAN: Dr. Radene JourneyAndrea Hernandez  CHIEF COMPLAINT:  No chief complaint on file.   HISTORY OF PRESENT ILLNESS:  Alejandra Stevenson  is a 30 y.o. female with No past medical history, no prior mental illness has been admitted to behavioral medicine unit for bipolar disorder with current maniac episode with psychotic symptoms. Patient was hyperreligious, had disorganized thought processes on admission. Also significant anxiety issues. Improving now. Medical consult has been requested for tachycardia. Patient complains of palpitations and was noted to have elevated heart rate. Patient states that usually anxiety causes her heart rate to go up. EKG done last night showing sinus tachycardia. TSH and electrolytes are within normal limits. WBC is elevated. Received a dose of metoprolol this morning with improvement.  PAST MEDICAL HISTORY:   Past Medical History:  Diagnosis Date  . Anemia   . Bipolar disorder (HCC)   . Headache     PAST SURGICAL HISTOIRY:  History reviewed. No pertinent surgical history.  SOCIAL HISTORY:   Social History  Substance Use Topics  . Smoking status: Former Smoker    Packs/day: 0.50    Types: Cigarettes  . Smokeless tobacco: Never Used  . Alcohol use No    FAMILY HISTORY:   Family History  Problem Relation Age of Onset  . Diabetes Mellitus II Father   . CAD Father     DRUG ALLERGIES:  No Known Allergies  REVIEW OF SYSTEMS:   Review of Systems  Constitutional: Negative for chills, fever, malaise/fatigue and weight loss.  HENT: Negative for ear discharge, ear pain, hearing loss, nosebleeds and tinnitus.   Eyes: Negative for blurred vision, double vision and photophobia.  Respiratory: Negative for cough, hemoptysis, shortness  of breath and wheezing.   Cardiovascular: Negative for chest pain, palpitations, orthopnea and leg swelling.  Gastrointestinal: Negative for abdominal pain, constipation, diarrhea, heartburn, melena, nausea and vomiting.  Genitourinary: Negative for dysuria, frequency, hematuria and urgency.  Musculoskeletal: Negative for back pain, myalgias and neck pain.  Skin: Negative for rash.  Neurological: Negative for dizziness, tingling, tremors, sensory change, speech change, focal weakness and headaches.  Endo/Heme/Allergies: Does not bruise/bleed easily.  Psychiatric/Behavioral: Negative for depression. The patient is nervous/anxious.       MEDICATIONS AT HOME:   Prior to Admission medications   Not on File      VITAL SIGNS:  Blood pressure 115/87, pulse (!) 139, temperature 98.1 F (36.7 C), temperature source Oral, resp. rate 18, height 5\' 6"  (1.676 m), weight 55.8 kg (123 lb), SpO2 100 %.  PHYSICAL EXAMINATION:   Physical Exam  GENERAL:  30 y.o.-year-old patient sitting in the bed with no acute distress.  EYES: Pupils equal, round, reactive to light and accommodation. No scleral icterus. Extraocular muscles intact.  HEENT: Head atraumatic, normocephalic. Oropharynx and nasopharynx clear.  NECK:  Supple, no jugular venous distention. No thyroid enlargement, no tenderness.  LUNGS: Normal breath sounds bilaterally, no wheezing, rales,rhonchi or crepitation. No use of accessory muscles of respiration.  CARDIOVASCULAR: S1, S2 normal. No murmurs, rubs, or gallops.  ABDOMEN: Soft, nontender, nondistended. Bowel sounds present. No organomegaly or mass.  EXTREMITIES: No pedal edema, cyanosis, or clubbing.  NEUROLOGIC: Cranial nerves II through XII are intact. Muscle strength 5/5 in all extremities. Sensation intact. Gait not checked.  PSYCHIATRIC:  The patient is alert and oriented x 3. Does not appear anxious, normal mood SKIN: No obvious rash, lesion, or ulcer.   LABORATORY PANEL:    CBC  Recent Labs Lab 05/16/16 1156  WBC 16.8*  HGB 14.4  HCT 43.7  PLT 352   ------------------------------------------------------------------------------------------------------------------  Chemistries   Recent Labs Lab 05/16/16 1156  NA 131*  K 4.7  CL 92*  CO2 32  GLUCOSE 77  BUN 12  CREATININE 0.79  CALCIUM 9.5   ------------------------------------------------------------------------------------------------------------------  Cardiac Enzymes No results for input(s): TROPONINI in the last 168 hours. ------------------------------------------------------------------------------------------------------------------  RADIOLOGY:  No results found.  EKG:   Orders placed or performed during the hospital encounter of 05/12/16  . EKG 12-Lead  . EKG 12-Lead    IMPRESSION AND PLAN:   Alejandra Stevenson  is a 30 y.o. female with No past medical history, no prior mental illness has been admitted to behavioral medicine unit for bipolar disorder with current maniac episode with psychotic symptoms. Medical consult requested for tachycardia.  #1 sinus tachycardia-likely from anxiety. Benign tachycardia. -Encouraged her to drink plenty of fluids. TSH and electrolytes within normal limits. -Started on metoprolol 25 mg twice a day -Continue to monitor. Patient is completely asymptomatic at this time  #2 leukocytosis-could be stress margination. Check urine analysis. -Advised to drink plenty of fluids.  #3 bipolar with mania and psychotic symptoms-no prior history. Being treated with lithium and Geodon. Also on Ativan as needed  #4 DVT prophylaxis-ambulatory    All the records are reviewed and case discussed with Consulting provider. Management plans discussed with the patient, family and they are in agreement.  CODE STATUS: Full Code  TOTAL TIME TAKING CARE OF THIS PATIENT: 50 minutes.    Melodi Happel M.D on 05/16/2016 at 2:17 PM  Between 7am to 6pm -  Pager - [phone]  After 6pm go to [email or web address] - password [email or web address]  [facility]  Office  [phone]  CC: Primary care Physician: No primary care provider on file.

## 2016-05-16 NOTE — Progress Notes (Signed)
Confused, "I need to call my husband, he is expecting my call ..." Observed jittery, anxious, "I think I am having panic attack ..." Attempted to make phone call twice; VS obtained, pulse still very elevated in 144 (max) and 133 (mini). Ativan 1 mg given for anxiety, Dr. Ardyth HarpsHernandez ordered Metoprolol tartrate 12.5 mg once and EKG STAT. Will monitor HR and provide feedback.

## 2016-05-16 NOTE — [facility] Group Notes (Signed)
[facility] Group Notes:  (Nursing/MHT/Case Management/Adjunct)  Date:  05/16/2016  Time:  9:14 PM  Type of Therapy:  Psychoeducational Skills  Participation Level:  Did Not Attend  Participation Quality:Summary of Progress/Problems:  Alejandra NeerJackie L Shakeda Stevenson 05/16/2016, 9:14 PM

## 2016-05-16 NOTE — Tx Team (Signed)
Interdisciplinary Treatment and Diagnostic Plan Update  05/16/2016 Time of Session: 10:30am Alejandra Stevenson MRN: [id number]  Principal Diagnosis: Bipolar affective disorder, current episode manic with psychotic symptoms (HCC)  Secondary Diagnoses: Principal Problem:   Bipolar affective disorder, current episode manic with psychotic symptoms (HCC)   Current Medications:  Current Facility-Administered Medications  Medication Dose Route Frequency Provider Last Rate Last Dose  . acetaminophen (TYLENOL) tablet 650 mg  650 mg Oral Q6H PRN Shari ProwsJolanta B Pucilowska, MD   650 mg at 05/13/16 0425  . alum & mag hydroxide-simeth (MAALOX/MYLANTA) 200-200-20 MG/5ML suspension 30 mL  30 mL Oral Q4H PRN Jolanta B Pucilowska, MD      . diphenhydrAMINE (BENADRYL) capsule 50 mg  50 mg Oral QHS Jimmy FootmanAndrea Hernandez-Gonzalez, MD   50 mg at 05/15/16 2111  . feeding supplement (ENSURE ENLIVE) (ENSURE ENLIVE) liquid 237 mL  237 mL Oral BID BM Jimmy FootmanAndrea Hernandez-Gonzalez, MD   237 mL at 05/16/16 1400  . lithium carbonate (ESKALITH) CR tablet 450 mg  450 mg Oral BID WC Jimmy FootmanAndrea Hernandez-Gonzalez, MD   450 mg at 05/16/16 1700  . LORazepam (ATIVAN) tablet 1 mg  1 mg Oral TID PRN Jimmy FootmanAndrea Hernandez-Gonzalez, MD   1 mg at 05/16/16 0002  . LORazepam (ATIVAN) tablet 2 mg  2 mg Oral QHS Jimmy FootmanAndrea Hernandez-Gonzalez, MD   2 mg at 05/15/16 2111  . magnesium hydroxide (MILK OF MAGNESIA) suspension 30 mL  30 mL Oral Daily PRN Charm RingsJamison Y Lord, NP      . metoprolol tartrate (LOPRESSOR) tablet 25 mg  25 mg Oral BID Enid Baasadhika Kalisetti, MD   25 mg at 05/16/16 1721  . neomycin-bacitracin-polymyxin (NEOSPORIN) ointment   Topical BID Charm RingsJamison Y Lord, NP   1 application at 05/16/16 1722  . ziprasidone (GEODON) capsule 80 mg  80 mg Oral BID WC Jimmy FootmanAndrea Hernandez-Gonzalez, MD   80 mg at 05/16/16 1721   PTA Medications: No prescriptions prior to admission.    Patient Stressors: Civil Service fast streamerducational concerns Financial difficulties Marital or family  conflict Occupational concerns  Patient Strengths: Active sense of humor Capable of independent living Communication skills General fund of knowledge Motivation for treatment/growth Physical Health Religious Affiliation Supportive family/friends  Treatment Modalities: Medication Management, Group therapy, Case management,  1 to 1 session with clinician, Psychoeducation, Recreational therapy.   Physician Treatment Plan for Primary Diagnosis: Bipolar affective disorder, current episode manic with psychotic symptoms (HCC) Long Term Goal(s): Improvement in symptoms so as ready for discharge NA   Short Term Goals: Ability to identify changes in lifestyle to reduce recurrence of condition will improve Ability to verbalize feelings will improve Ability to disclose and discuss suicidal ideas Ability to demonstrate self-control will improve Ability to identify and develop effective coping behaviors will improve Ability to maintain clinical measurements within normal limits will improve NA  Medication Management: Evaluate patient's response, side effects, and tolerance of medication regimen.  Therapeutic Interventions: 1 to 1 sessions, Unit Group sessions and Medication administration.  Evaluation of Outcomes: Progressing  Physician Treatment Plan for Secondary Diagnosis: Principal Problem:   Bipolar affective disorder, current episode manic with psychotic symptoms (HCC)  Long Term Goal(s): Improvement in symptoms so as ready for discharge NA   Short Term Goals: Ability to identify changes in lifestyle to reduce recurrence of condition will improve Ability to verbalize feelings will improve Ability to disclose and discuss suicidal ideas Ability to demonstrate self-control will improve Ability to identify and develop effective coping behaviors will improve Ability to maintain clinical measurements within  normal limits will improve NA     Medication Management: Evaluate patient's  response, side effects, and tolerance of medication regimen.  Therapeutic Interventions: 1 to 1 sessions, Unit Group sessions and Medication administration.  Evaluation of Outcomes: Progressing   RN Treatment Plan for Primary Diagnosis: Bipolar affective disorder, current episode manic with psychotic symptoms (HCC) Long Term Goal(s): Knowledge of disease and therapeutic regimen to maintain health will improve  Short Term Goals: Ability to verbalize frustration and anger appropriately will improve, Ability to demonstrate self-control, Ability to participate in decision making will improve, Ability to identify and develop effective coping behaviors will improve and Compliance with prescribed medications will improve  Medication Management: RN will administer medications as ordered by provider, will assess and evaluate patient's response and provide education to patient for prescribed medication. RN will report any adverse and/or side effects to prescribing provider.  Therapeutic Interventions: 1 on 1 counseling sessions, Psychoeducation, Medication administration, Evaluate responses to treatment, Monitor vital signs and CBGs as ordered, Perform/monitor CIWA, COWS, AIMS and Fall Risk screenings as ordered, Perform wound care treatments as ordered.  Evaluation of Outcomes: Progressing   LCSW Treatment Plan for Primary Diagnosis: Bipolar affective disorder, current episode manic with psychotic symptoms (HCC) Long Term Goal(s): Safe transition to appropriate next level of care at discharge, Engage patient in therapeutic group addressing interpersonal concerns.  Short Term Goals: Engage patient in aftercare planning with referrals and resources, Increase ability to appropriately verbalize feelings, Facilitate acceptance of mental health diagnosis and concerns, Facilitate patient progression through stages of change regarding substance use diagnoses and concerns, Identify triggers associated with  mental health/substance abuse issues and Increase skills for wellness and recovery  Therapeutic Interventions: Assess for all discharge needs, 1 to 1 time with Social worker, Explore available resources and support systems, Assess for adequacy in community support network, Educate family and significant other(s) on suicide prevention, Complete Psychosocial Assessment, Interpersonal group therapy.  Evaluation of Outcomes: Progressing   Progress in Treatment: Attending groups: Yes. Participating in groups: Yes. Taking medication as prescribed: Yes. Toleration medication: Yes. Family/Significant other contact made: Yes, individual(s) contacted:  Marice Potter, Husband Patient understands diagnosis: Yes. Discussing patient identified problems/goals with staff: Yes. Medical problems stabilized or resolved: Yes. Denies suicidal/homicidal ideation: Yes. Issues/concerns per patient self-inventory: Yes. Other:    New problem(s) identified: No, Describe:     New Short Term/Long Term Goal(s):  Discharge Plan or Barriers: Discharge Home and follow up with [facility] in [place]   Reason for Continuation of Hospitalization: Delusions  Hallucinations Medication stabilization  Estimated Length of Stay: 7 days   Attendees: Patient: Alejandra Stevenson 05/16/2016 5:29 PM  Physician: Radene Journey, MD 05/16/2016 5:29 PM  Nursing: Leonia Reader, RN 05/16/2016 5:29 PM  RN Care Manager: 05/16/2016 5:29 PM  Social Worker: Jake Shark, LCSW 05/16/2016 5:29 PM  Recreational Therapist: Hershal Coria LRT 05/16/2016 5:29 PM  Other:  05/16/2016 5:29 PM  Other:  05/16/2016 5:29 PM  Other: 05/16/2016 5:29 PM    Scribe for Treatment Team: Glennon Mac, LCSW 05/16/2016 5:29 PM
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
## 2016-05-16 NOTE — Progress Notes (Signed)
EKG performed and given to Abiod[place], [employer]

## 2016-05-16 NOTE — Progress Notes (Signed)
Patient pleasant and cooperative. Attending group. Denies SI, HI, AVH. Interacting appropriately with staff and peers. Pt requested to go home. Discussed with patient her length of stay. Pt verbalized understanding.  Encouragement and support offered. Encouraged patient to use coping skills when peers angered her.  Pt receptive and remains safe on unit with q 15 min checks.

## 2016-05-16 NOTE — [facility] Group Notes (Signed)
[facility] Group Notes:  (Nursing/MHT/Case Management/Adjunct)  Date:  05/16/2016  Time:  5:38 PM  Type of Therapy:  Psychoeducational Skills  Participation Level:  Active  Participation Quality:  Appropriate and Attentive  Affect:  Appropriate  Cognitive:  Appropriate  Insight:  Improving  Engagement in Group:  Engaged  Modes of Intervention:  Discussion and Education  Summary of Progress/Problems:  Alejandra Stevenson 05/16/2016, 5:38 PM

## 2016-05-16 NOTE — Progress Notes (Signed)
Recreation Therapy Notes  Date: 10.25.17 Time: 9:30 am Location: Craft Room  Group Topic: Self-esteem  Goal Area(s) Addresses:  Patient will write at least one positive trait about self. Patient will verbalize benefit of having a healthy self-esteem.  Behavioral Response: Attentive, Left early  Intervention: I Am  Activity: Patients were given a worksheet with the letter I on it and were instructed to write as many positive traits about themselves inside the letter.  Education: LRT educated patients on ways they can increase their self-esteem.  Education Outcome: Patient left before LRT educated group.   Clinical Observations/Feedback: Patient wrote positive traits. Patient left group at approximately 9:47 am and returned when group was ending.  Jacquelynn CreeGreene,Tahjai Schetter M, LRT/CTRS 05/16/2016 10:02 AM

## 2016-05-16 NOTE — [facility] Group Notes (Signed)
[facility] LCSW Group Therapy   05/16/2016  9:30 am   Type of Therapy: Group Therapy   Participation Level: Active   Participation Quality: Attentive, Sharing and Supportive   Affect: Depressed and Flat   Cognitive: Alert and Oriented   Insight: Developing/Improving and Engaged   Engagement in Therapy: Developing/Improving and Engaged   Modes of Intervention: Clarification, Confrontation, Discussion, Education, Exploration, Limit-setting, Orientation, Problem-solving, Rapport Building, Dance movement psychotherapisteality Testing, Socialization and Support   Summary of Progress/Problems: The topic for group today was emotional regulation. This group focused on both positive and negative emotion identification and allowed  group members to process ways to identify feelings, regulate negative emotions, and find healthy ways to manage internal/external emotions. Group members were asked to reflect on a time when their reaction to an emotion led to a negative outcome and explored how alternative responses using emotion regulation would have benefited them. Group members were also asked to discuss a time when emotion regulation was utilized when a negative emotion was experienced. Pt shared that the pt regulates the pt's emotions by meditating and using breathing exercises.  Pt shared that this relaxes the pt.  Pt shared that the pt is able to "slow down and take a breath" after using these techniques.  Pt was polite and cooperative with the CSW and other group members and focused and attentive to the topics discussed and the sharing of others.     Dorothe PeaJonathan F. Ivie Savitt, MSW, LCSWA, LCAS

## 2016-05-16 NOTE — Progress Notes (Signed)
Patient responded well to Lopressor: HR=96, BP=111/76. EKG: 104bpm ST with short PR, minimal voltage criteria for LVH.

## 2016-05-17 LAB — BASIC METABOLIC PANEL
Anion gap: 6 (ref 5–15)
BUN: 10 mg/dL (ref 6–20)
CHLORIDE: 95 mmol/L — AB (ref 101–111)
CO2: 33 mmol/L — ABNORMAL HIGH (ref 22–32)
CREATININE: 0.89 mg/dL (ref 0.44–1.00)
Calcium: 9.4 mg/dL (ref 8.9–10.3)
Glucose, Bld: 103 mg/dL — ABNORMAL HIGH (ref 65–99)
POTASSIUM: 4.1 mmol/L (ref 3.5–5.1)
SODIUM: 134 mmol/L — AB (ref 135–145)

## 2016-05-17 LAB — URINALYSIS COMPLETE WITH MICROSCOPIC (ARMC ONLY)
Bilirubin Urine: NEGATIVE
GLUCOSE, UA: NEGATIVE mg/dL
Ketones, ur: NEGATIVE mg/dL
Nitrite: NEGATIVE
PROTEIN: NEGATIVE mg/dL
Specific Gravity, Urine: 1.003 — ABNORMAL LOW (ref 1.005–1.030)
pH: 7 (ref 5.0–8.0)

## 2016-05-17 MED ORDER — NITROFURANTOIN MONOHYD MACRO 100 MG PO CAPS
100.0000 mg | ORAL_CAPSULE | Freq: Two times a day (BID) | ORAL | Status: DC
  Administered 2016-05-17 – 2016-05-21 (×9): 100 mg via ORAL
  Filled 2016-05-17 (×9): qty 1

## 2016-05-17 NOTE — [facility] Group Notes (Signed)
[facility] Group Notes:  (Nursing/MHT/Case Management/Adjunct)  Date:  05/17/2016  Time:  4:12 PM  Type of Therapy:  Psychoeducational Skills  Participation Level:  Did Not Attend  Sangeeta C Thailan Sava 05/17/2016, 4:12 PM 

## 2016-05-17 NOTE — [facility] Group Notes (Signed)
Goals Group Date/Time: 05/17/2016 9:00 AM Type of Therapy and Topic: Group Therapy: Goals Group: SMART Goals   Participation Level: Moderate  Description of Group:    The purpose of a daily goals group is to assist and guide patients in setting recovery/wellness-related goals. The objective is to set goals as they relate to the crisis in which they were admitted. Patients will be using SMART goal modalities to set measurable goals. Characteristics of realistic goals will be discussed and patients will be assisted in setting and processing how one will reach their goal. Facilitator will also assist patients in applying interventions and coping skills learned in psycho-education groups to the SMART goal and process how one will achieve defined goal.   Therapeutic Goals:   -Patients will develop and document one goal related to or their crisis in which brought them into treatment.  -Patients will be guided by LCSW using SMART goal setting modality in how to set a measurable, attainable, realistic and time sensitive goal.  -Patients will process barriers in reaching goal.  -Patients will process interventions in how to overcome and successful in reaching goal.   Patient's Goal:   Pt reported the pt's goal is to return "home with my family and my child".  Pt shared this would assist the pt in avoiding another crisis.  Pt feels this goal is measurable and in line with SMART goal modalities.  Pt shared this goal is also a healthy coping mechanism for the pt.   Therapeutic Modalities:  Motivational Interviewing  Research officer, political partyCognitive Behavioral Therapy  Crisis Intervention Model  SMART goals setting   Dorothe PeaJonathan F. Marina Desire, LCSWA, LCAS

## 2016-05-17 NOTE — Progress Notes (Signed)
Results for Lazaro ArmsWILKES, Elzena A (MRN [id number]) as of 05/17/2016 01:41  Ref. Range 05/16/2016 11:56  Sodium Latest Ref Range: 135 - 145 mmol/L 131 (L)  WBC Latest Ref Range: 3.6 - 11.0 K/uL 16.8 (H)   Patient has a cut (healing old wound) on her right elbow that may be a cause of her infection if cultured: WBC=16.8 and may be an underlying cause of her confusion and hallucinations. Broad spectrum antibiotic is recommended.

## 2016-05-17 NOTE — Progress Notes (Signed)
D:  Patient denies SI/AVH/HI. Patient attended group sessions but wold otherwise lay in bed in her room.  Patient appropriate in interactions with peers and staff. A:  Patient offered support and encouragement.  Patient administered scheduled medications. R:  Patient safety maintained with 15 minute checks.  Patient provided with a safe environment.

## 2016-05-17 NOTE — Plan of Care (Signed)
Problem: Safety: Goal: Periods of time without injury will increase Outcome: Progressing Patient has remained free from injury this shift.   
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
## 2016-05-17 NOTE — Plan of Care (Addendum)
Problem: Fluid Volume: Goal: Ability to maintain a balanced intake and output will improve Outcome: Progressing Gatorade offered, her Na=131; fluid encouraged, patient complied and improving. Awaiting Urine specimen. Estimated sleep hours of 7 hours and 15 minutes.

## 2016-05-17 NOTE — [facility] Group Notes (Signed)
[facility] LCSW Group Therapy   05/17/2016 1pm   Type of Therapy: Group Therapy   Participation Level: Active   Participation Quality: Attentive, Sharing and Supportive   Affect: Appropriate   Cognitive: Alert and Oriented   Insight: Developing/Improving and Engaged   Engagement in Therapy: Developing/Improving and Engaged   Modes of Intervention: Clarification, Confrontation, Discussion, Education, Exploration, Limit-setting, Orientation, Problem-solving, Rapport Building, Dance movement psychotherapisteality Testing, Socialization and Support   Summary of Progress/Problems: The topic for group was balance in life. Today's group focused on defining balance in one's own words, identifying things that can knock one off balance, and exploring healthy ways to maintain balance in life. Group members were asked to provide an example of a time when they felt off balance, describe how they handled that situation, and process healthier ways to regain balance in the future. Group members were asked to share the most important tool for maintaining [facility] and how they plan to apply this method after discharge. Pt shared that for the pt balance means spending time with family. Pt shared when the pt felt off balance the pt becomes depressed.  Pt shared that when the patient takes a nap pt is able to cope in a healthy way.  Pt was polite and cooperative with the CSW and other group members and focused and attentive to the topics discussed and the sharing of others.  Pt seems to have made great improvement during group, as evidenced by increased sharing, sharing at length when prompted and pt presents as happy and increasingly energetic, as compared to previous sessions.      Dorothe PeaJonathan F. Mahari Strahm, LCSWA, LCAS

## 2016-05-17 NOTE — Progress Notes (Signed)
[employer] - [place] [facility]   PATIENT NAME: Alejandra Stevenson    MR#:  [id number]  DATE OF BIRTH:  04-Oct-1985  SUBJECTIVE:  CHIEF COMPLAINT:  No chief complaint on file.  No complaint. REVIEW OF SYSTEMS:  Review of Systems  Constitutional: Negative for chills and fever.  HENT: Negative for congestion.   Eyes: Negative for blurred vision and double vision.  Respiratory: Negative for cough and shortness of breath.   Cardiovascular: Negative for chest pain and leg swelling.  Gastrointestinal: Negative for abdominal pain, diarrhea, nausea and vomiting.  Genitourinary: Negative for dysuria, frequency and urgency.  Musculoskeletal: Negative for joint pain.  Neurological: Negative for dizziness, focal weakness, loss of consciousness and headaches.  Psychiatric/Behavioral: Negative for depression. The patient is not nervous/anxious.     DRUG ALLERGIES:  No Known Allergies VITALS:  Blood pressure 125/74, pulse (!) 106, temperature 98.1 F (36.7 C), temperature source Oral, resp. rate 18, height 5\' 6"  (1.676 m), weight 123 lb (55.8 kg), SpO2 100 %. PHYSICAL EXAMINATION:  Physical Exam  Constitutional: She is oriented to person, place, and time and well-developed, well-nourished, and in no distress. No distress.  HENT:  Head: Normocephalic.  Mouth/Throat: Oropharynx is clear and moist.  Eyes: Conjunctivae and EOM are normal. No scleral icterus.  Neck: Normal range of motion. Neck supple. No JVD present.  Cardiovascular: Normal rate, regular rhythm and normal heart sounds.  Exam reveals no gallop.   No murmur heard. Pulmonary/Chest: Effort normal and breath sounds normal. No respiratory distress. She has no wheezes. She has no rales.  Abdominal: Soft. Bowel sounds are normal. She exhibits no distension. There is no tenderness.  Musculoskeletal: Normal range of motion. She exhibits no edema or tenderness.  Neurological: She is alert and oriented to person, place,  and time. No cranial nerve deficit.  Skin: No rash noted. No erythema.  Psychiatric: Affect normal.   LABORATORY PANEL:   CBC  Recent Labs Lab 05/16/16 1156  WBC 16.8*  HGB 14.4  HCT 43.7  PLT 352   ------------------------------------------------------------------------------------------------------------------ Chemistries   Recent Labs Lab 05/17/16 0835  NA 134*  K 4.1  CL 95*  CO2 33*  GLUCOSE 103*  BUN 10  CREATININE 0.89  CALCIUM 9.4   RADIOLOGY:  No results found. ASSESSMENT AND PLAN:   Alejandra Stevenson  is a 30 y.o. female with No past medical history, no prior mental illness has been admitted to behavioral medicine unit for bipolar disorder with current maniac episode with psychotic symptoms. Medical consult requested for tachycardia.  #1 sinus tachycardia-likely from anxiety. Benign tachycardia. Improved. -Encouraged her to drink plenty of fluids. TSH and electrolytes within normal limits. -Started on metoprolol 25 mg twice a day -Continue to monitor. Patient is completely asymptomatic at this time  #2 leukocytosis-could be stress margination. Normal urine analysis. -Advised to drink plenty of fluids.  #3 bipolar with mania and psychotic symptoms-no prior history. Being treated with lithium and Geodon. Also on Ativan as needed  Hyponatremia. Improved.  Medically stable, sign off.  All the records are reviewed and case discussed with Care Management/Social Worker. Management plans discussed with the patient, family and they are in agreement.  CODE STATUS: Full code  TOTAL TIME TAKING CARE OF THIS PATIENT: 20 minutes.   More than 50% of the time was spent in counseling/coordination of care: YES  POSSIBLE D/C IN ? DAYS, DEPENDING ON CLINICAL CONDITION.   Shaune Pollack M.D on 05/17/2016 at 1:36 PM  Between 7am  to 6pm - Pager - [phone]  After 6pm go to [email or web address] - Scientist, research (life sciences)password [email or web address]  [facility] [place] [facility]  Office   [phone]  CC: Primary care physician; No primary care provider on file.  Note: This dictation was prepared with Dragon dictation along with smaller phrase technology. Any transcriptional errors that result from this process are unintentional.

## 2016-05-17 NOTE — Progress Notes (Signed)
Recreation Therapy Notes  Date: 10.26.17 Time: 9:30 am Location: Craft Room  Group Topic: Leisure Education  Goal Area(s) Addresses:  Patient will identify activities for each letter of the alphabet. Patient will verbalize ability to integrate positive leisure into life post d/c. Patient will verbalize ability to use leisure as a Associate Professorcoping skill.  Behavioral Response: Attentive, Interactive  Intervention: Leisure Alphabet  Activity: Patients were given a Leisure Information systems managerAlphabet worksheet and were instructed to write healthy leisure activities for each letter of the alphabet.  Education: LRT educated patients on what they need to participate in leisure.  Education Outcome: Acknowledges education/In group clarification offered  Clinical Observations/Feedback: Patient completed activity by writing healthy leisure activities. Patient contributed to group discussion by stating some of her healthy leisure activities.  Jacquelynn CreeGreene,Ramata Strothman M, LRT/CTRS 05/17/2016 10:23 AM

## 2016-05-18 LAB — CBC
HCT: 42.1 % (ref 35.0–47.0)
Hemoglobin: 14.2 g/dL (ref 12.0–16.0)
MCH: 29.1 pg (ref 26.0–34.0)
MCHC: 33.7 g/dL (ref 32.0–36.0)
MCV: 86.5 fL (ref 80.0–100.0)
PLATELETS: 291 10*3/uL (ref 150–440)
RBC: 4.87 MIL/uL (ref 3.80–5.20)
RDW: 13 % (ref 11.5–14.5)
WBC: 12.1 10*3/uL — AB (ref 3.6–11.0)

## 2016-05-18 LAB — URINE CULTURE

## 2016-05-18 MED ORDER — LORAZEPAM 1 MG PO TABS
1.0000 mg | ORAL_TABLET | Freq: Every day | ORAL | Status: DC
Start: 2016-05-18 — End: 2016-05-21
  Administered 2016-05-18 – 2016-05-19 (×2): 1 mg via ORAL
  Filled 2016-05-18 (×2): qty 1

## 2016-05-18 MED ORDER — DIPHENHYDRAMINE HCL 25 MG PO CAPS
25.0000 mg | ORAL_CAPSULE | Freq: Every day | ORAL | Status: DC
  Administered 2016-05-18 – 2016-05-19 (×2): 25 mg via ORAL
  Filled 2016-05-18 (×2): qty 1

## 2016-05-18 NOTE — Plan of Care (Signed)
Problem: Pain Managment: Goal: General experience of comfort will improve Outcome: Progressing Pt reports feeling good. Sleeping well. Appetite good.

## 2016-05-18 NOTE — Progress Notes (Signed)
D: Observed pt visiting with family. Patient alert and oriented x4. Patient denies SI/HI/AVH. Pt affect is anxious. Pt did not endorse delusions, and was not responding to internal stimuli this evening. Pt did appear to have insight into her recent hallucinations and delusions because pt stated "they said I was delusional" when writer asked about the events that led to her admission. Pt also mentioned that her previous belief that her husband was a Programmer, multimediapreacher was not accurate. Pt rated depression 3/10 and anxiety 3/10.  A: Offered active listening and support. Provided therapeutic communication. Administered scheduled medications. Encouraged pt to continue attending group.   R: Pt pleasant and cooperative. Pt tried to hide Macrobid medication after having difficulty swallowing it. Writer had to ask pt twice, before pt admitted she didn't swallow pill. Writer got applesauce, and pt was able to take pill. Will continue Q15 min. checks. Safety maintained.

## 2016-05-18 NOTE — [facility] Group Notes (Signed)
[facility] LCSW Group Therapy   05/18/2016 1 PM   Type of Therapy: Group Therapy   Participation Level: Active   Participation Quality: Attentive, Sharing and Supportive   Affect: Appropriate   Cognitive: Alert and Oriented   Insight: Developing/Improving and Engaged   Engagement in Therapy: Developing/Improving and Engaged   Modes of Intervention: Clarification, Confrontation, Discussion, Education, Exploration, Limit-setting, Orientation, Problem-solving, Rapport Building, Dance movement psychotherapisteality Testing, Socialization and Support   Summary of Progress/Problems: The topic for today was feelings about relapse. Pt discussed what relapse prevention is to them and identified triggers that they are on the path to relapse. Pt processed their feeling towards relapse and was able to relate to peers. Pt discussed coping skills that can be used for relapse prevention. Pt discussed what relapse means to the pt and reported that relapse means "not taking meds" and coming back to the hospital.  Pt shared that the pt feels bad about this and that in order to cope with this, the pt remembers the good times in order to avoid relapse. Pt presented as pleasant and calm.  Pt seems to have made great improvement during group, as evidenced by increased sharing, sharing at length when prompted and pt presents as happy and increasingly energetic, as compared to previous sessions.       Dorothe PeaJonathan F. Tagen Milby, MSW, LCSWA, LCAS

## 2016-05-18 NOTE — Tx Team (Signed)
Interdisciplinary Treatment and Diagnostic Plan Update  05/18/2016 Time of Session: 11:00am Alejandra Stevenson MRN: [id number]  Principal Diagnosis: Bipolar affective disorder, current episode manic with psychotic symptoms (HCC)  Secondary Diagnoses: Principal Problem:   Bipolar affective disorder, current episode manic with psychotic symptoms (HCC)   Current Medications:  Current Facility-Administered Medications  Medication Dose Route Frequency Provider Last Rate Last Dose  . acetaminophen (TYLENOL) tablet 650 mg  650 mg Oral Q6H PRN Shari Prows, MD   650 mg at 05/13/16 0425  . alum & mag hydroxide-simeth (MAALOX/MYLANTA) 200-200-20 MG/5ML suspension 30 mL  30 mL Oral Q4H PRN Jolanta B Pucilowska, MD      . diphenhydrAMINE (BENADRYL) capsule 25 mg  25 mg Oral QHS Jimmy Footman, MD      . feeding supplement (ENSURE ENLIVE) (ENSURE ENLIVE) liquid 237 mL  237 mL Oral BID BM Jimmy Footman, MD   237 mL at 05/18/16 1000  . lithium carbonate (ESKALITH) CR tablet 450 mg  450 mg Oral BID WC Jimmy Footman, MD   450 mg at 05/18/16 0802  . LORazepam (ATIVAN) tablet 1 mg  1 mg Oral TID PRN Jimmy Footman, MD   1 mg at 05/17/16 0636  . LORazepam (ATIVAN) tablet 1 mg  1 mg Oral QHS Jimmy Footman, MD      . magnesium hydroxide (MILK OF MAGNESIA) suspension 30 mL  30 mL Oral Daily PRN Charm Rings, NP      . metoprolol tartrate (LOPRESSOR) tablet 25 mg  25 mg Oral BID Enid Baas, MD   25 mg at 05/18/16 0714  . neomycin-bacitracin-polymyxin (NEOSPORIN) ointment   Topical BID Charm Rings, NP   1 application at 05/18/16 0715  . nitrofurantoin (macrocrystal-monohydrate) (MACROBID) capsule 100 mg  100 mg Oral Q12H Jimmy Footman, MD   100 mg at 05/18/16 0802  . ziprasidone (GEODON) capsule 80 mg  80 mg Oral BID WC Jimmy Footman, MD   80 mg at 05/18/16 0802   PTA Medications: No prescriptions prior to  admission.    Patient Stressors: [employer] fast streamer difficulties Marital or family conflict Occupational concerns  Patient Strengths: Active sense of humor Capable of independent living Communication skills General fund of knowledge Motivation for treatment/growth Physical Health Religious Affiliation Supportive family/friends  Treatment Modalities: Medication Management, Group therapy, Case management,  1 to 1 session with clinician, Psychoeducation, Recreational therapy.   Physician Treatment Plan for Primary Diagnosis: Bipolar affective disorder, current episode manic with psychotic symptoms (HCC) Long Term Goal(s): Improvement in symptoms so as ready for discharge NA   Short Term Goals: Ability to identify changes in lifestyle to reduce recurrence of condition will improve Ability to verbalize feelings will improve Ability to disclose and discuss suicidal ideas Ability to demonstrate self-control will improve Ability to identify and develop effective coping behaviors will improve Ability to maintain clinical measurements within normal limits will improve NA  Medication Management: Evaluate patient's response, side effects, and tolerance of medication regimen.  Therapeutic Interventions: 1 to 1 sessions, Unit Group sessions and Medication administration.  Evaluation of Outcomes: Progressing  Physician Treatment Plan for Secondary Diagnosis: Principal Problem:   Bipolar affective disorder, current episode manic with psychotic symptoms (HCC)  Long Term Goal(s): Improvement in symptoms so as ready for discharge NA   Short Term Goals: Ability to identify changes in lifestyle to reduce recurrence of condition will improve Ability to verbalize feelings will improve Ability to disclose and discuss suicidal ideas Ability to demonstrate self-control will  improve Ability to identify and develop effective coping behaviors will improve Ability to maintain clinical  measurements within normal limits will improve NA     Medication Management: Evaluate patient's response, side effects, and tolerance of medication regimen.  Therapeutic Interventions: 1 to 1 sessions, Unit Group sessions and Medication administration.  Evaluation of Outcomes: Progressing   RN Treatment Plan for Primary Diagnosis: Bipolar affective disorder, current episode manic with psychotic symptoms (HCC) Long Term Goal(s): Knowledge of disease and therapeutic regimen to maintain health will improve  Short Term Goals: Ability to verbalize frustration and anger appropriately will improve, Ability to demonstrate self-control, Ability to participate in decision making will improve, Ability to identify and develop effective coping behaviors will improve and Compliance with prescribed medications will improve  Medication Management: RN will administer medications as ordered by provider, will assess and evaluate patient's response and provide education to patient for prescribed medication. RN will report any adverse and/or side effects to prescribing provider.  Therapeutic Interventions: 1 on 1 counseling sessions, Psychoeducation, Medication administration, Evaluate responses to treatment, Monitor vital signs and CBGs as ordered, Perform/monitor CIWA, COWS, AIMS and Fall Risk screenings as ordered, Perform wound care treatments as ordered.  Evaluation of Outcomes: Progressing   LCSW Treatment Plan for Primary Diagnosis: Bipolar affective disorder, current episode manic with psychotic symptoms (HCC) Long Term Goal(s): Safe transition to appropriate next level of care at discharge, Engage patient in therapeutic group addressing interpersonal concerns.  Short Term Goals: Engage patient in aftercare planning with referrals and resources, Increase ability to appropriately verbalize feelings, Facilitate acceptance of mental health diagnosis and concerns, Facilitate patient progression through  stages of change regarding substance use diagnoses and concerns, Identify triggers associated with mental health/substance abuse issues and Increase skills for wellness and recovery  Therapeutic Interventions: Assess for all discharge needs, 1 to 1 time with Social worker, Explore available resources and support systems, Assess for adequacy in community support network, Educate family and significant other(s) on suicide prevention, Complete Psychosocial Assessment, Interpersonal group therapy.  Evaluation of Outcomes: Progressing   Progress in Treatment: Attending groups: Yes. Participating in groups: Yes. Taking medication as prescribed: Yes. Toleration medication: Yes. Family/Significant other contact made: Yes, individual(s) contacted:  husband Patient understands diagnosis: Yes. Discussing patient identified problems/goals with staff: Yes. Medical problems stabilized or resolved: Yes. Denies suicidal/homicidal ideation: Yes. Issues/concerns per patient self-inventory: No. Other: n/a  New problem(s) identified: None identified at this time.   New Short Term/Long Term Goal(s): None identified at this time.   Discharge Plan or Barriers: Patient will have psychiatric follow-up with [facility].   Reason for Continuation of Hospitalization: Mania Medication stabilization  Estimated Length of Stay: 5 days  Attendees: Patient: Alejandra Stevenson 05/18/2016 1:26 PM  Physician: Dr. Radene JourneyAndrea HernandezJayme Cloud- Gonzalez, MD 05/18/2016 1:26 PM  Nursing: Leonia ReaderPhyllis Cobb, RN 05/18/2016 1:26 PM  RN Care Manager: 05/18/2016 1:26 PM  Social Worker: Fredrich BirksAmaris G. Garnette CzechSampson MSW, LCSWA 05/18/2016 1:26 PM  Recreational Therapist: Jacquelynn CreeElizabeth M. Greene, LRT/CTRS 05/18/2016 1:26 PM  Other:  05/18/2016 1:26 PM  Other:  05/18/2016 1:26 PM  Other: 05/18/2016 1:26 PM    Scribe for Treatment Team: Arelia LongestAmaris G Cedric Denison, LCSWA 05/18/2016 1:31 PM

## 2016-05-18 NOTE — Plan of Care (Signed)
Problem: Education: Goal: Will be free of psychotic symptoms Outcome: Progressing Pt was not endorsing any delusions. Pt did not appear to be responding to internal stimuli.

## 2016-05-18 NOTE — Progress Notes (Signed)
Recreation Therapy Notes  Date: 10.27.17 Time: 9:30 am Location: Craft Room  Group Topic: Coping Skills  Goal Area(s) Addresses:  Patient will identify things they are grateful for. Patient will identify how being grateful can influence decision making.  Behavioral Response: Attentive, Interactive, Left early  Intervention: Grateful Wheel  Activity: Patients were given an I Am Grateful For worksheet and instructed to write 2-3 things they were grateful for under each category.  Education: LRT educated patients on why it is important to be grateful.  Education Outcome: Patient left before LRT educated group.  Clinical Observations/Feedback: Patient wrote things she was grateful for. Patient contributed to group discussion by stating things she was grateful for, what category had more things she was grateful for in it, her common thread, that she does participate in leisure activities she is grateful for, and how being aware of what she is grateful for affects her decision making skills. Patient left group at approximately 9:50 am and did not return to group.  Jacquelynn CreeGreene,Jovonda Selner M, LRT/CTRS 05/18/2016 10:01 AM

## 2016-05-18 NOTE — [facility] Group Notes (Signed)
[facility] Group Notes:  (Nursing/MHT/Case Management/Adjunct)  Date:  05/18/2016  Time:  1:27 AM  Type of Therapy:  Psychoeducational Skills  Participation Level:  Active  Participation Quality:  Appropriate and Sharing  Affect:  Appropriate  Cognitive:  Appropriate  Insight:  Appropriate and Good  Engagement in Group:  Engaged  Modes of Intervention:  Discussion, Socialization and Support  Summary of Progress/Problems:  Alejandra MilroyLaquanda Y Kenden Stevenson 05/18/2016, 1:27 AM

## 2016-05-18 NOTE — Progress Notes (Signed)
Patient pleasant and cooperative with care. No negative behaviors. Denies SI, HI, AVH. Interacting with staff and peers appropriately. No bizarre behaviors noted. Encouragement and support offered. Encouraged patient to attend group and continue with medication compliance. Pt receptive, verbalizes understanding and remains safe on unit with q 15 min checks..Marland Kitchen
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
## 2016-05-19 NOTE — Progress Notes (Signed)
Pt denies SI/HI/AVH. Came to RN station at 0145 and stated she needed something for sleep and anxiety. She stated " I don't think I am bipolar, I just cannot sleep". Pt cooperative with medications. Voices no additional concerns at this time.  Encouragement and support provided. Safety maintained. Will continue to monitor.

## 2016-05-19 NOTE — [facility] Group Notes (Signed)
[facility] LCSW Group Therapy  05/19/2016 2:05 PM  Type of Therapy:  Group Therapy  Participation Level:  Minimal  Participation Quality:  Attentive  Affect:  Appropriate  Cognitive:  Alert  Insight:  Improving  Engagement in Therapy:  Improving  Modes of Intervention:  Discussion, Education, Reality Testing and Support  Summary of Progress/Problems:Coping Skills: Patients defined and discussed healthy coping skills. Patients identified healthy coping skills they would like to try during hospitalization and after discharge. CSW offered insight to varying coping skills that may have been new to patients such as practicing mindfulness.  Trina Asch G. Garnette CzechSampson MSW, LCSWA 05/19/2016, 2:06 PM

## 2016-05-19 NOTE — Progress Notes (Addendum)
[facility] MD Progress Note  05/19/2016 2:18 PM Alejandra Stevenson  MRN:  [id number] Subjective:   Pt with Bipolar affective disorder, current episode manic with psychotic symptoms ([place]).   Chart reviewed, discussed with care team. Pt med  complaint, slept well, cooperative. Met pt in her room, pt slightly anxious, , cooperative, has poor insight , believes she does not need to be on psy meds, but agrees to take meds and for  lithium level on Sunday. Denies AVH, denies SI/HI.  Principal Problem: Bipolar affective disorder, current episode manic with psychotic symptoms ([place]) Diagnosis:   Patient Active Problem List   Diagnosis Date Noted  . Bipolar affective disorder, current episode manic with psychotic symptoms ([place]) [F31.2] 05/09/2016   Total Time spent with patient: 30 minutes  Past Psychiatric History: None  Past Medical History:  Past Medical History:  Diagnosis Date  . Anemia   . Bipolar disorder ([place])   . Headache    History reviewed. No pertinent surgical history. Family History:  Family History  Problem Relation Age of Onset  . Diabetes Mellitus II Father   . CAD Father    Family Psychiatric  History: father with bipolar  Social History: no change  History  Alcohol Use No     History  Drug Use No    Social History   Social History  . Marital status: Married    Spouse name: N/A  . Number of children: N/A  . Years of education: N/A   Social History Main Topics  . Smoking status: Former Smoker    Packs/day: 0.50    Types: Cigarettes  . Smokeless tobacco: Never Used  . Alcohol use No  . Drug use: No  . Sexual activity: Yes    Birth control/ protection: None     Comment: on menstrual period   Other Topics Concern  . None   Social History Narrative   Lives at home with Husband and a child. Active and independent at baseline.   Additional Social History:    History of alcohol / drug use?: No history of alcohol / drug abuse     Sleep: Good  Appetite:   Fair  Current Medications: Current Facility-Administered Medications  Medication Dose Route Frequency Provider Last Rate Last Dose  . acetaminophen (TYLENOL) tablet 650 mg  650 mg Oral Q6H PRN Clovis Fredrickson, MD   650 mg at 05/13/16 0425  . alum & mag hydroxide-simeth (MAALOX/MYLANTA) 200-200-20 MG/5ML suspension 30 mL  30 mL Oral Q4H PRN Jolanta B Pucilowska, MD      . diphenhydrAMINE (BENADRYL) capsule 25 mg  25 mg Oral QHS Hildred Priest, MD   25 mg at 05/18/16 2215  . feeding supplement (ENSURE ENLIVE) (ENSURE ENLIVE) liquid 237 mL  237 mL Oral BID BM Hildred Priest, MD   237 mL at 05/19/16 1000  . lithium carbonate (ESKALITH) CR tablet 450 mg  450 mg Oral BID WC Hildred Priest, MD   450 mg at 05/19/16 0835  . LORazepam (ATIVAN) tablet 1 mg  1 mg Oral TID PRN Hildred Priest, MD   1 mg at 05/19/16 0149  . LORazepam (ATIVAN) tablet 1 mg  1 mg Oral QHS Hildred Priest, MD   1 mg at 05/18/16 2215  . magnesium hydroxide (MILK OF MAGNESIA) suspension 30 mL  30 mL Oral Daily PRN Patrecia Pour, NP      . metoprolol tartrate (LOPRESSOR) tablet 25 mg  25 mg Oral BID Gladstone Lighter, MD  25 mg at 05/19/16 0833  . neomycin-bacitracin-polymyxin (NEOSPORIN) ointment   Topical BID Patrecia Pour, NP   1 application at 93/73/42 [phone]  . nitrofurantoin (macrocrystal-monohydrate) (MACROBID) capsule 100 mg  100 mg Oral Q12H Hildred Priest, MD   100 mg at 05/19/16 0836  . ziprasidone (GEODON) capsule 80 mg  80 mg Oral BID WC Hildred Priest, MD   80 mg at 05/19/16 1157    Lab Results:  Results for orders placed or performed during the hospital encounter of 05/12/16 (from the past 48 hour(s))  CBC     Status: Abnormal   Collection Time: 05/18/16  6:39 AM  Result Value Ref Range   WBC 12.1 (H) 3.6 - 11.0 K/uL   RBC 4.87 3.80 - 5.20 MIL/uL   Hemoglobin 14.2 12.0 - 16.0 g/dL   HCT 42.1 35.0 - 47.0 %   MCV 86.5 80.0 - 100.0 fL    MCH 29.1 26.0 - 34.0 pg   MCHC 33.7 32.0 - 36.0 g/dL   RDW 13.0 11.5 - 14.5 %   Platelets 291 150 - 440 K/uL    Blood Alcohol level:  Lab Results  Component Value Date   ETH <5 26/20/3559    Metabolic Disorder Labs: Lab Results  Component Value Date   HGBA1C 4.8 05/13/2016   MPG 91 05/13/2016   No results found for: PROLACTIN Lab Results  Component Value Date   CHOL 177 05/13/2016   TRIG 63 05/13/2016   HDL 59 05/13/2016   CHOLHDL 3.0 05/13/2016   VLDL 13 05/13/2016   LDLCALC 105 (H) 05/13/2016    Physical Findings: AIMS: Facial and Oral Movements Muscles of Facial Expression: None, normal Lips and Perioral Area: None, normal Jaw: None, normal Tongue: None, normal,Extremity Movements Upper (arms, wrists, hands, fingers): None, normal Lower (legs, knees, ankles, toes): None, normal, Trunk Movements Neck, shoulders, hips: None, normal, Overall Severity Severity of abnormal movements (highest score from questions above): None, normal Incapacitation due to abnormal movements: None, normal Patient's awareness of abnormal movements (rate only patient's report): No Awareness, Dental Status Current problems with teeth and/or dentures?: No Does patient usually wear dentures?: No   Musculoskeletal: Strength & Muscle Tone: within normal limits Gait & Station: normal Patient leans: N/A  Psychiatric Specialty Exam: Physical Exam  Nursing note and vitals reviewed. Constitutional: She is oriented to person, place, and time. She appears well-developed and well-nourished.  HENT:  Head: Normocephalic.  Eyes: Conjunctivae and EOM are normal. Pupils are equal, round, and reactive to light.  Cardiovascular: Normal rate and regular rhythm.   Respiratory: Breath sounds normal.  Neurological: She is alert and oriented to person, place, and time.    Review of Systems  Constitutional: Positive for chills.  HENT: Negative.   Eyes: Negative.   Respiratory: Negative.    Cardiovascular: Negative.   Gastrointestinal: Negative.   Genitourinary: Negative.   Musculoskeletal: Negative.   Skin: Negative.   Neurological: Negative.   Psychiatric/Behavioral: Negative.  Negative for hallucinations. The patient is not nervous/anxious and does not have insomnia.     Blood pressure 118/77, pulse (!) 107, temperature 99 F (37.2 C), temperature source Oral, resp. rate 18, height 5' 6"  (1.676 m), weight 55.8 kg (123 lb), SpO2 100 %.Body mass index is 19.85 kg/m.  General Appearance: Fairly Groomed  Eye Contact:  Good  Speech:  Clear and Coherent  Volume:  Normal  Mood:  Good  Affect:  Appropriate and Congruent, anxious  Thought Process:  Coherent  Orientation:  Full (Time, Place, and Person)  Thought Content:  WDL and Hallucinations: None  Suicidal Thoughts:  No  Homicidal Thoughts:  No  Memory:  Immediate;   Good  Judgement:  Good  Insight:  limited  Psychomotor Activity:  Negative  Concentration:  Concentration: Good  Recall:  Good  Fund of Knowledge:  Fair  Language:  Good  Akathisia:  No  Handed:    AIMS (if indicated):     Assets:  Communication Skills Desire for Improvement Social Support  ADL's:  Intact  Cognition:  WNL  Sleep:  Number of Hours: 7.45     Treatment Plan Summary: Daily contact with patient to assess and evaluate symptoms and progress in treatment   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 Geodon ordered 80 mg twice a day.    Trying to set a family meeting but unable to reach pt's husband today Lithium level on Sunday ordered. Lithium level will be ordered on Sunday am (orders will need to be placed on Saturday)  For anxiety and agitation patient started on Ativan 1 mg by mouth every 8 hours as needed  For EPS Benadryl  decreased to 25 mgby mouth at bedtime due to daytime sleepiness---pt c/o sedation with the 50 mg dose  For  insomnia: Decrease Ativan 1 mg by mouth daily at bedtime due to daytime sleepiness----will decrease ativan from 2 mg to 1 mg due to sedation  Tachycardia: continue metoprolol to 25 mg po bid. Internal medicine was consulted on 10/25 and agreed with the increase in dosage.   Elevated WBC: UA showed possible UTI as source on 05/16/16. Patient was started on Macrobid capsule 100 mg Q12 hours. Patient provided another urine sample to have it cultured. The culture came back this AM hand showed multiple species present despite a clean catch. CBC today showed decrease in WBC to 12.1. Continue the Macrobid 6 more days  Labs hemoglobin A1c and lipid panels are within the normal limits. TSH is within the normal limits. Alcohol level was below the detection limit. Urine toxicology screen was negative.Pregnancy test was negative  EKG 05/16/16: 104bpm ST with short PR, minimal voltage criteria for LVH.  HR today: 106 bpm, continue metoprolol  Head CT is within the normal limits  Diet regular  Precautions every 15 minute checks  Hospitalization status IVC  Disposition she will discharge back to her home once stable    Plan to d/c early next week, per Dr. Cherly Hensen.   Lenward Chancellor, MD 05/19/2016, 2:18 PM

## 2016-05-19 NOTE — Progress Notes (Signed)
Nursing Progress Note 7a-7p  D) Patient presents pleasant today. Patient reports having a "great day". Patient states goal is to "work on self care" including "taking medicines, showering, and resting". Patient reports that she "misses my husband and son" and "can't wait to go home". Patient denies SI/HI/AVH or pain. Patient contracts for safety at this time.   A) Emotional support and encouragement given. Patient on safety checks. Opportunities for questions or concerns presented to patient. Patient encouraged to continue to work on identifying ways to take care of herself and prepare for discharge.  R)  Patient receptive to interaction with nurse. Patient remains safe on the unit at this time. Patient is interactive in milieu.

## 2016-05-19 NOTE — Consult Note (Signed)
[facility] Patient was exhibiting manic behaviors but was engaging politely with CH and other patients. Spoke about her son and trips to the zoo.

## 2016-05-19 NOTE — [facility] Group Notes (Signed)
[facility] Group Notes:  (Nursing/MHT/Case Management/Adjunct)  Date:  05/19/2016  Time:  5:35 AM  Type of Therapy:  Psychoeducational Skills  Participation Level:  Did Not Attend  Summary of Progress/Problems:  Alejandra MilroyLaquanda Y Jacarra Stevenson 05/19/2016, 5:35 AM

## 2016-05-20 LAB — CBC WITH DIFFERENTIAL/PLATELET
Basophils Absolute: 0.1 10*3/uL (ref 0–0.1)
Basophils Relative: 1 %
EOS ABS: 0.3 10*3/uL (ref 0–0.7)
Eosinophils Relative: 3 %
HCT: 39.7 % (ref 35.0–47.0)
HEMOGLOBIN: 13.8 g/dL (ref 12.0–16.0)
LYMPHS ABS: 2.7 10*3/uL (ref 1.0–3.6)
LYMPHS PCT: 25 %
MCH: 29.8 pg (ref 26.0–34.0)
MCHC: 34.9 g/dL (ref 32.0–36.0)
MCV: 85.4 fL (ref 80.0–100.0)
Monocytes Absolute: 0.6 10*3/uL (ref 0.2–0.9)
Monocytes Relative: 5 %
NEUTROS PCT: 66 %
Neutro Abs: 7 10*3/uL — ABNORMAL HIGH (ref 1.4–6.5)
Platelets: 270 10*3/uL (ref 150–440)
RBC: 4.64 MIL/uL (ref 3.80–5.20)
RDW: 12.9 % (ref 11.5–14.5)
WBC: 10.6 10*3/uL (ref 3.6–11.0)

## 2016-05-20 LAB — LITHIUM LEVEL: Lithium Lvl: 0.65 mmol/L (ref 0.60–1.20)

## 2016-05-20 LAB — COMPREHENSIVE METABOLIC PANEL
ALK PHOS: 44 U/L (ref 38–126)
ALT: 13 U/L — AB (ref 14–54)
AST: 13 U/L — ABNORMAL LOW (ref 15–41)
Albumin: 3.9 g/dL (ref 3.5–5.0)
Anion gap: 5 (ref 5–15)
BUN: 8 mg/dL (ref 6–20)
CALCIUM: 8.9 mg/dL (ref 8.9–10.3)
CO2: 29 mmol/L (ref 22–32)
CREATININE: 0.74 mg/dL (ref 0.44–1.00)
Chloride: 104 mmol/L (ref 101–111)
GFR calc non Af Amer: >60 mL/min (ref >60)
GLUCOSE: 100 mg/dL — AB (ref 65–99)
Potassium: 3.8 mmol/L (ref 3.5–5.1)
SODIUM: 138 mmol/L (ref 135–145)
Total Bilirubin: <0.1 mg/dL — ABNORMAL LOW (ref 0.3–1.2)
Total Protein: 6.9 g/dL (ref 6.5–8.1)

## 2016-05-20 LAB — GLUCOSE, CAPILLARY: Glucose-Capillary: 119 mg/dL — ABNORMAL HIGH (ref 65–99)

## 2016-05-20 NOTE — Progress Notes (Signed)
Pt awake, alert, oriented, up on unit in dayroom today. Appropriately interacts with peers/staff. Much improvement noted in thoughts. Denies SI/HI/AVH, pain. No longer thinks other staff/peers are her husband. Medication complaint. Reports good sleep with the help of sleep medication, good appetite, normal energy and good concentration. Rates depression 3/10, hopelessness 0/10,a dn anxiety 3/10 (low 0-10 high). Goal for the day is "foucusing on myself and family = discharge" by "group." Pt did talk to her husband and 56 y/o son on the telephone, and reported a good discussion with them to begin her day.  Support and encouragement provided with use of therapeutic communication. Medications administered as ordered with education. Safety maintained with every 15 minute checks. Will continue to monitor.

## 2016-05-20 NOTE — Progress Notes (Signed)
D: Pt denies SI/HI/AVH. Pt stated " I am going to take my time getting better". Stated that she would be taking her medications after discharge to avoid having to be admitted here again. Denies pain and voices no additional concerns at this time. A: Encouragement and support provided. q15 minute checks maintained for safety. Medications given as prescribed.  R: Pt did not attend evening group and rested in room. Medication compliant. Appropriate during interaction. Remains safe on unit.

## 2016-05-20 NOTE — Plan of Care (Signed)
Problem: Activity: Goal: Sleeping patterns will improve Outcome: Progressing Pt has been sleeping at least 7 hours at night.

## 2016-05-20 NOTE — Progress Notes (Signed)
[facility] MD Progress Note  05/20/2016 1:31 PM Alejandra Stevenson  MRN:  [id number] Subjective:   Pt with Bipolar affective disorder, current episode manic with psychotic symptoms ([place]).   Chart reviewed, discussed with care team. Pt med  complaint, slept well, cooperative. Met pt ,  pt slightly anxious, , cooperative, has poor insight , believes she does not need to be on psy meds, but agrees to take meds .  Denies AVH, denies SI/HI.   Pt denies side effects. Li level - 0.65. Principal Problem: Bipolar affective disorder, current episode manic with psychotic symptoms ([place]) Diagnosis:   Patient Active Problem List   Diagnosis Date Noted  . Bipolar affective disorder, current episode manic with psychotic symptoms ([place]) [F31.2] 05/09/2016   Total Time spent with patient: 30 minutes  Past Psychiatric History: None  Past Medical History:  Past Medical History:  Diagnosis Date  . Anemia   . Bipolar disorder ([place])   . Headache    History reviewed. No pertinent surgical history. Family History:  Family History  Problem Relation Age of Onset  . Diabetes Mellitus II Father   . CAD Father    Family Psychiatric  History: father with bipolar  Social History: no change  History  Alcohol Use No     History  Drug Use No    Social History   Social History  . Marital status: Married    Spouse name: N/A  . Number of children: N/A  . Years of education: N/A   Social History Main Topics  . Smoking status: Former Smoker    Packs/day: 0.50    Types: Cigarettes  . Smokeless tobacco: Never Used  . Alcohol use No  . Drug use: No  . Sexual activity: Yes    Birth control/ protection: None     Comment: on menstrual period   Other Topics Concern  . None   Social History Narrative   Lives at home with Husband and a child. Active and independent at baseline.   Additional Social History:    History of alcohol / drug use?: No history of alcohol / drug abuse     Sleep: Good  Appetite:   Fair  Current Medications: Current Facility-Administered Medications  Medication Dose Route Frequency Provider Last Rate Last Dose  . acetaminophen (TYLENOL) tablet 650 mg  650 mg Oral Q6H PRN Clovis Fredrickson, MD   650 mg at 05/13/16 0425  . alum & mag hydroxide-simeth (MAALOX/MYLANTA) 200-200-20 MG/5ML suspension 30 mL  30 mL Oral Q4H PRN Jolanta B Pucilowska, MD      . diphenhydrAMINE (BENADRYL) capsule 25 mg  25 mg Oral QHS Hildred Priest, MD   25 mg at 05/19/16 2121  . feeding supplement (ENSURE ENLIVE) (ENSURE ENLIVE) liquid 237 mL  237 mL Oral BID BM Hildred Priest, MD   237 mL at 05/20/16 1000  . lithium carbonate (ESKALITH) CR tablet 450 mg  450 mg Oral BID WC Hildred Priest, MD   450 mg at 05/20/16 0818  . LORazepam (ATIVAN) tablet 1 mg  1 mg Oral TID PRN Hildred Priest, MD   1 mg at 05/19/16 0149  . LORazepam (ATIVAN) tablet 1 mg  1 mg Oral QHS Hildred Priest, MD   1 mg at 05/19/16 2121  . magnesium hydroxide (MILK OF MAGNESIA) suspension 30 mL  30 mL Oral Daily PRN Patrecia Pour, NP      . metoprolol tartrate (LOPRESSOR) tablet 25 mg  25 mg Oral BID Gladstone Lighter,  MD   25 mg at 05/20/16 0817  . neomycin-bacitracin-polymyxin (NEOSPORIN) ointment   Topical BID Patrecia Pour, NP   1 application at 62/70/35 0093  . nitrofurantoin (macrocrystal-monohydrate) (MACROBID) capsule 100 mg  100 mg Oral Q12H Hildred Priest, MD   100 mg at 05/20/16 0818  . ziprasidone (GEODON) capsule 80 mg  80 mg Oral BID WC Hildred Priest, MD   80 mg at 05/20/16 8182    Lab Results:  Results for orders placed or performed during the hospital encounter of 05/12/16 (from the past 48 hour(s))  Lithium level     Status: None   Collection Time: 05/20/16  6:21 AM  Result Value Ref Range   Lithium Lvl 0.65 0.60 - 1.20 mmol/L  CBC with Differential/Platelet     Status: Abnormal   Collection Time: 05/20/16  6:21 AM  Result Value  Ref Range   WBC 10.6 3.6 - 11.0 K/uL   RBC 4.64 3.80 - 5.20 MIL/uL   Hemoglobin 13.8 12.0 - 16.0 g/dL   HCT 39.7 35.0 - 47.0 %   MCV 85.4 80.0 - 100.0 fL   MCH 29.8 26.0 - 34.0 pg   MCHC 34.9 32.0 - 36.0 g/dL   RDW 12.9 11.5 - 14.5 %   Platelets 270 150 - 440 K/uL   Neutrophils Relative % 66 %   Neutro Abs 7.0 (H) 1.4 - 6.5 K/uL   Lymphocytes Relative 25 %   Lymphs Abs 2.7 1.0 - 3.6 K/uL   Monocytes Relative 5 %   Monocytes Absolute 0.6 0.2 - 0.9 K/uL   Eosinophils Relative 3 %   Eosinophils Absolute 0.3 0 - 0.7 K/uL   Basophils Relative 1 %   Basophils Absolute 0.1 0 - 0.1 K/uL  Comprehensive metabolic panel     Status: Abnormal   Collection Time: 05/20/16  6:21 AM  Result Value Ref Range   Sodium 138 135 - 145 mmol/L   Potassium 3.8 3.5 - 5.1 mmol/L   Chloride 104 101 - 111 mmol/L   CO2 29 22 - 32 mmol/L   Glucose, Bld 100 (H) 65 - 99 mg/dL   BUN 8 6 - 20 mg/dL   Creatinine, Ser 0.74 0.44 - 1.00 mg/dL   Calcium 8.9 8.9 - 10.3 mg/dL   Total Protein 6.9 6.5 - 8.1 g/dL   Albumin 3.9 3.5 - 5.0 g/dL   AST 13 (L) 15 - 41 U/L   ALT 13 (L) 14 - 54 U/L   Alkaline Phosphatase 44 38 - 126 U/L   Total Bilirubin <0.1 (L) 0.3 - 1.2 mg/dL   GFR calc non Af Amer >60 >60 mL/min   GFR calc Af Amer >60 >60 mL/min    Comment: (NOTE) The eGFR has been calculated using the CKD EPI equation. This calculation has not been validated in all clinical situations. eGFR's persistently <60 mL/min signify possible Chronic Kidney Disease.    Anion gap 5 5 - 15    Blood Alcohol level:  Lab Results  Component Value Date   ETH <5 99/37/1696    Metabolic Disorder Labs: Lab Results  Component Value Date   HGBA1C 4.8 05/13/2016   MPG 91 05/13/2016   No results found for: PROLACTIN Lab Results  Component Value Date   CHOL 177 05/13/2016   TRIG 63 05/13/2016   HDL 59 05/13/2016   CHOLHDL 3.0 05/13/2016   VLDL 13 05/13/2016   LDLCALC 105 (H) 05/13/2016    Physical Findings: AIMS:  Facial and  Oral Movements Muscles of Facial Expression: None, normal Lips and Perioral Area: None, normal Jaw: None, normal Tongue: None, normal,Extremity Movements Upper (arms, wrists, hands, fingers): None, normal Lower (legs, knees, ankles, toes): None, normal, Trunk Movements Neck, shoulders, hips: None, normal, Overall Severity Severity of abnormal movements (highest score from questions above): None, normal Incapacitation due to abnormal movements: None, normal Patient's awareness of abnormal movements (rate only patient's report): No Awareness, Dental Status Current problems with teeth and/or dentures?: No Does patient usually wear dentures?: No   Musculoskeletal: Strength & Muscle Tone: within normal limits Gait & Station: normal Patient leans: N/A  Psychiatric Specialty Exam: Physical Exam  Nursing note and vitals reviewed. Constitutional: She is oriented to person, place, and time. She appears well-developed and well-nourished.  HENT:  Head: Normocephalic.  Eyes: Conjunctivae and EOM are normal. Pupils are equal, round, and reactive to light.  Cardiovascular: Normal rate and regular rhythm.   Respiratory: Breath sounds normal.  Neurological: She is alert and oriented to person, place, and time.    Review of Systems  Constitutional: Positive for chills.  HENT: Negative.   Eyes: Negative.   Respiratory: Negative.   Cardiovascular: Negative.   Gastrointestinal: Negative.   Genitourinary: Negative.   Musculoskeletal: Negative.   Skin: Negative.   Neurological: Negative.   Psychiatric/Behavioral: Negative.  Negative for hallucinations. The patient is not nervous/anxious and does not have insomnia.     Blood pressure 132/71, pulse (!) 110, temperature 98.5 F (36.9 C), temperature source Oral, resp. rate 16, height 5' 6"  (1.676 m), weight 55.8 kg (123 lb), SpO2 98 %.Body mass index is 19.85 kg/m.  General Appearance: Fairly Groomed  Eye Contact:  Good  Speech:   Clear and Coherent  Volume:  Normal  Mood:  Good  Affect:  Appropriate and Congruent, less anxious  Thought Process:  Coherent  Orientation:  Full (Time, Place, and Person)  Thought Content:  WDL and Hallucinations: None  Suicidal Thoughts:  No  Homicidal Thoughts:  No  Memory:  Immediate;   Good  Judgement:  Good  Insight:  limited  Psychomotor Activity:  Negative  Concentration:  Concentration: Good  Recall:  Good  Fund of Knowledge:  Fair  Language:  Good  Akathisia:  No  Handed:    AIMS (if indicated):     Assets:  Communication Skills Desire for Improvement Social Support  ADL's:  Intact  Cognition:  WNL  Sleep:  Number of Hours: 8.15     Treatment Plan Summary: Daily contact with patient to assess and evaluate symptoms and progress in treatment   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 Geodon ordered 80 mg twice a day.    Lithium level 0.65.  For anxiety and agitation patient started on Ativan 1 mg by mouth every 8 hours as needed  For EPS Benadryl  decreased to 25 mgby mouth at bedtime due to daytime sleepiness---pt c/o sedation with the 50 mg dose   Labs hemoglobin A1c and lipid panels are within the normal limits. TSH is within the normal limits. Alcohol level was below the detection limit. Urine toxicology screen was negative.Pregnancy test was negative  EKG 05/16/16: 104bpm ST with short PR, minimal voltage criteria for LVH.   Head CT is within the normal limits  Diet regular  Precautions every 15 minute checks  Hospitalization status IVC  Disposition she will discharge back to her home once stable  Plan to d/c early next week, per Dr. Cherly Hensen.   Lenward Chancellor, MD 05/20/2016, 1:31 PM

## 2016-05-20 NOTE — H&P (Signed)
Pt in dayroom c/o of weakness, lowered head into lap. Pallor noted. Vitals assessed and were WNL. CBG results (119) WNL. Pt given OJ and fluids. No c/o of pain. Pt A&O x 4.  Pallor has subsided. No distress noted at this time. Pt states " I am feeling better, just weak and tired". MD on call notified orders obtained to hold evening medications, excluding antibiotic. Will continue to assess.  Pt noted to be taking Lopressor BID.

## 2016-05-20 NOTE — Plan of Care (Signed)
Problem: Role Relationship: Goal: Ability to interact with others will improve Outcome: Progressing Pt appropriately interacts with peers/staff. No longer thinks peers/staff are her husband. Oriented to room/unit.

## 2016-05-20 NOTE — [facility] Group Notes (Signed)
[facility] LCSW Group Therapy  05/20/2016 2:07 PM  Type of Therapy:  Group Therapy  Participation Level:  Active  Participation Quality:  Attentive  Affect:  Appropriate  Cognitive:  Alert  Insight:  Improving  Engagement in Therapy:  Improving  Modes of Intervention:  Activity, Discussion, Education and Support  Summary of Progress/Problems: Self esteem: Patients discussed self esteem and how it impacts them. They discussed what aspects in their lives has influenced their self esteem. They were challenged to identify changes that are needed in order to improve self esteem. Patients participated in activity where they had to identify positive adjectives they felt described their personality. Patients shared with the group on the following areas: Things I am good at, What I like about my appearance, I've helped others by, What I value the most, compliments I have received, challenges I have overcome, thing that make me unique, and Times I've made others happy.   Sula Fetterly G. Garnette CzechSampson MSW, LCSWA 05/20/2016, 2:08 PM

## 2016-05-20 NOTE — [facility] Group Notes (Signed)
[facility] Group Notes:  (Nursing/MHT/Case Management/Adjunct)  Date:  05/20/2016  Time:  12:21 AM  Type of Therapy:  Group Therapy  Participation Level:  Did Not Attend :  Alejandra Stevenson 05/20/2016, 12:21 AM

## 2016-05-21 MED ORDER — METOPROLOL TARTRATE 25 MG PO TABS: 12.5000 mg | ORAL_TABLET | Freq: Two times a day (BID) | ORAL | Status: DC

## 2016-05-21 MED ORDER — LITHIUM CARBONATE ER 450 MG PO TBCR: 450.0000 mg | EXTENDED_RELEASE_TABLET | Freq: Two times a day (BID) | ORAL | 0 refills | Status: AC

## 2016-05-21 MED ORDER — DIPHENHYDRAMINE HCL 25 MG PO CAPS: 25.0000 mg | ORAL_CAPSULE | Freq: Every day | ORAL | 0 refills | Status: DC

## 2016-05-21 MED ORDER — LORAZEPAM 1 MG PO TABS: 1.0000 mg | ORAL_TABLET | Freq: Every evening | ORAL | 0 refills | Status: AC | PRN

## 2016-05-21 MED ORDER — ZIPRASIDONE HCL 80 MG PO CAPS: 80.0000 mg | ORAL_CAPSULE | Freq: Two times a day (BID) | ORAL | 0 refills | Status: AC

## 2016-05-21 MED ORDER — METOPROLOL TARTRATE 25 MG PO TABS: 12.5000 mg | ORAL_TABLET | Freq: Two times a day (BID) | ORAL | 0 refills | Status: AC

## 2016-05-21 MED ORDER — LORAZEPAM 1 MG PO TABS: 1.0000 mg | ORAL_TABLET | Freq: Every evening | ORAL | 0 refills | Status: DC | PRN

## 2016-05-21 MED ORDER — NITROFURANTOIN MONOHYD MACRO 100 MG PO CAPS: 100.0000 mg | ORAL_CAPSULE | Freq: Two times a day (BID) | ORAL | 0 refills | Status: DC

## 2016-05-21 MED ORDER — METOPROLOL TARTRATE 25 MG PO TABS: 25.0000 mg | ORAL_TABLET | Freq: Two times a day (BID) | ORAL | 0 refills | Status: DC

## 2016-05-21 NOTE — Plan of Care (Signed)
Problem: Activity: Goal: Interest or engagement in activities will improve Outcome: Progressing Pt attending evening groups and socializes in dayroom with peers during free time.

## 2016-05-21 NOTE — [facility] Group Notes (Signed)
[facility] LCSW Group Therapy Note  Date/Time: 05/21/2016 1:00pm  Type of Therapy and Topic:  Group Therapy:  Overcoming Obstacles  Participation Level:  Good participation   Description of Group:    In this group patients will be encouraged to explore what they see as obstacles to their own wellness and recovery. They will be guided to discuss their thoughts, feelings, and behaviors related to these obstacles. The group will process together ways to cope with barriers, with attention given to specific choices patients can make. Each patient will be challenged to identify changes they are motivated to make in order to overcome their obstacles. This group will be process-oriented, with patients participating in exploration of their own experiences as well as giving and receiving support and challenge from other group members.  Therapeutic Goals: 1. Patient will identify personal and current obstacles as they relate to admission. 2. Patient will identify barriers that currently interfere with their wellness or overcoming obstacles.  3. Patient will identify feelings, thought process and behaviors related to these barriers. 4. Patient will identify two changes they are willing to make to overcome these obstacles:    Summary of Patient Progress Pt participated shares her excitement regarding being discharged today.  Able to achieve therapeutic goals.

## 2016-05-21 NOTE — [facility] Suicide Risk Assessment (Signed)
[facility]   Principal Problem: Bipolar affective disorder, current episode manic with psychotic symptoms [facility](HCC) Discharge Diagnoses:  Patient Active Problem List   Diagnosis Date Noted  . Bipolar affective disorder, current episode manic with psychotic symptoms (HCC) [F31.2] 05/09/2016      Psychiatric Specialty Exam: ROS  Blood pressure 123/74, pulse 93, temperature 98.1 F (36.7 C), temperature source Oral, resp. rate 18, height 5\' 6"  (1.676 m), weight 55.8 kg (123 lb), SpO2 100 %.Body mass index is 19.85 kg/m.                                                       Mental Status Per Nursing Assessment::   On Admission:  NA (denies SI)  Demographic Factors:  Caucasian  Loss Factors: NA  Historical Factors: NA  Risk Reduction Factors:   Responsible for children under 32 years of age, Sense of responsibility to family, Religious beliefs about death, Living with another person, especially a relative, Positive social support and Positive coping skills or problem solving skills  Continued Clinical Symptoms:  Mania and psychosis now resolved  Cognitive Features That Contribute To Risk:  None    Suicide Risk:  Minimal: No identifiable suicidal ideation.  Patients presenting with no risk factors but with morbid ruminations; may be classified as minimal risk based on the severity of the depressive symptoms    Jimmy FootmanHernandez-Gonzalez,  Wilder Kurowski, MD 05/21/2016, 9:08 AM

## 2016-05-21 NOTE — Progress Notes (Signed)
Patient discharged home with husband. Seven day supply of medications given. Follow up appointments and medications reviewed with patient. All questions answered. Information about medication management reviewed with counselor. All belongings returned to patient. Patient denies SI, HI, and AVH. No psychosis.

## 2016-05-21 NOTE — Progress Notes (Signed)
Pt denies SI/HI/AVH. Cooperative with plan of care. Visible in milieu with appropriate interaction among staff and peers. Voices no additional concerns at this time. Safety maintained.

## 2016-05-21 NOTE — Progress Notes (Signed)
Recreation Therapy Notes  Date: 10.30.17 Time: 9:30 am Location: Craft Room  Group Topic: Self-expression  Goal Area(s) Addresses:  Patient will write at least one emotion they are experiencing. Patient will verbalize benefit of using art as a form of self-expression  Behavioral Response: Attentive, Interactive  Intervention: Bottled Up  Activity: Patients were instructed to draw a bottle the way they see themselves. Patients were told to write the emotions they were feeling inside the bottle.  Education: LRT educated patients on other forms of self-expression.  Education Outcome: Acknowledges education/In group clarification offered   Clinical Observations/Feedback: Patient completed activity by drawing a bottle and writing the emotions she was feeling inside the bottle. Patient contributed to group discussion by stating what emotions she was feeling and which was one prominent, how this activity made her feel, that it was not difficult to see her emotions on paper, and how she felt after getting her emotions on paper.  Jacquelynn CreeGreene,Rashanda Magloire M, LRT/CTRS 05/21/2016 10:15 AM

## 2016-05-21 NOTE — Progress Notes (Signed)
  [facility]  Will you be returning to the same living situation after discharge:  Yes,  home with husband At discharge, do you have transportation home?: Yes,  husband will pick pt up. Do you have the ability to pay for your medications: No.  Release of information consent forms completed and in the chart;  Patient's signature needed at discharge.  Patient to Follow up at: Follow-up Information    [facility]. Go on 05/23/2016.   Why:  Please arrive to your hospital follow-up appointment with [facility] on November 1st 1:30PM. It is important that you bring your discharge paperwork to this appoinment. Bring ID to your appt. Arrive 15 minutes early for prompt services. Contact information: [facility] [employer]   (Main Office)  [address], [address]  [place], [place]  [address]  Ph:  [phone]  (fax) [phone]          Next level of care provider [facility]:no  Safety Planning and Suicide Prevention discussed: Yes,  SPE completed with patient and husband.  Have you used any form of tobacco in the last 30 days? (Cigarettes, Smokeless Tobacco, Cigars, and/or Pipes): No  Has patient been referred to the Quitline?: N/A patient is not a smoker  Patient has been referred for addiction treatment: N/A  Lynden OxfordKadijah R Birgit Nowling, MSW, LCSW-A 05/21/2016, 11:57 AM

## 2016-05-21 NOTE — [facility] Group Notes (Signed)
[facility] Group Notes:  (Nursing/MHT/Case Management/Adjunct)  Date:  05/21/2016  Time:  4:44 AM  Type of Therapy:  Psychoeducational Skills  Participation Level:  Did Not Attend   Summary of Progress/Problems:  Alejandra MilroyLaquanda Y Shatana Stevenson 05/21/2016, 4:44 AM
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
## 2016-05-21 NOTE — Progress Notes (Signed)
Eating breakfast in the dayroom.

## 2016-05-31 ENCOUNTER — Ambulatory Visit: Payer: Self-pay | Admitting: Pharmacy Technician

## 2016-05-31 NOTE — Progress Notes (Signed)
Patient scheduled for eligibility appointment at [facility].  Patient did not show for the appointment on 05/31/16.  Patient did not reschedule eligibility appointment.

## 2016-06-28 ENCOUNTER — Ambulatory Visit: Payer: Self-pay | Admitting: Pharmacy Technician

## 2016-06-28 NOTE — Progress Notes (Signed)
Patient scheduled for eligibility appointment at [facility].  Patient did not show for the appointment on 06/28/16 at 2:00p.m.  Patient did not reschedule eligibility appointment.  Sherilyn DacostaBetty J. Ambrea Hegler Care Manager [facility]

## 2017-11-16 IMAGING — CT CT HEAD W/O CM
3 of 5 series · 16 of 47 positions shown, 19 images · non-contrast
Comparison: None.

CLINICAL DATA: 30-year-old female with bizarre behavior, psychosis.
Combative. Initial encounter.

EXAM:
CT HEAD WITHOUT CONTRAST
TECHNIQUE: Contiguous axial images were obtained from the base of the skull
through the vertex without intravenous contrast.

[Series 4: thins · axial · 0.45mm/px · z∈[-169,-57]mm · 10 of 183 slices shown, 13 images]
[im 17/183  brain]
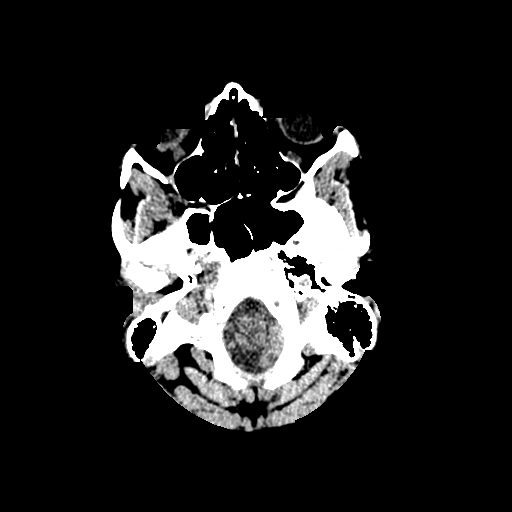
[im 17/183  bone]
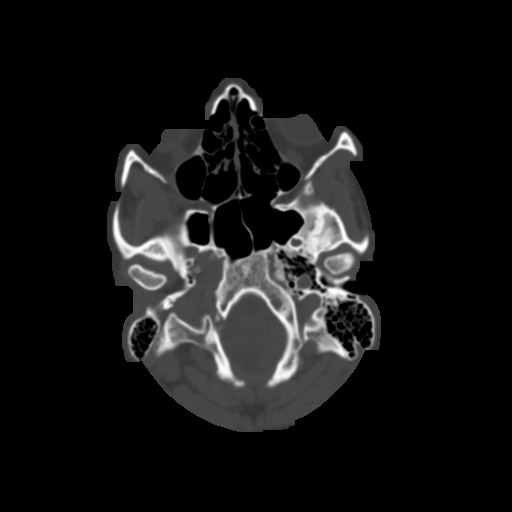
[im 34/183  brain]
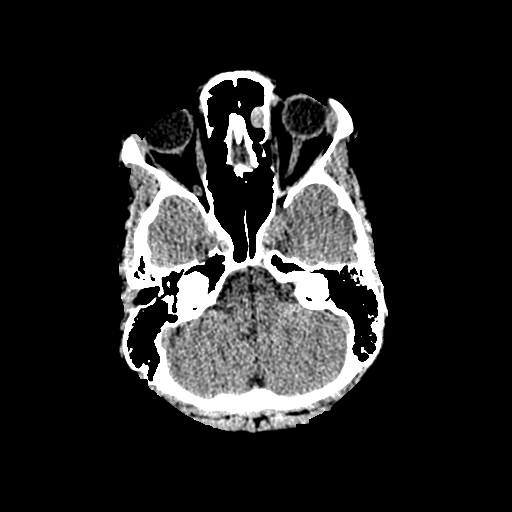
[im 50/183  brain]
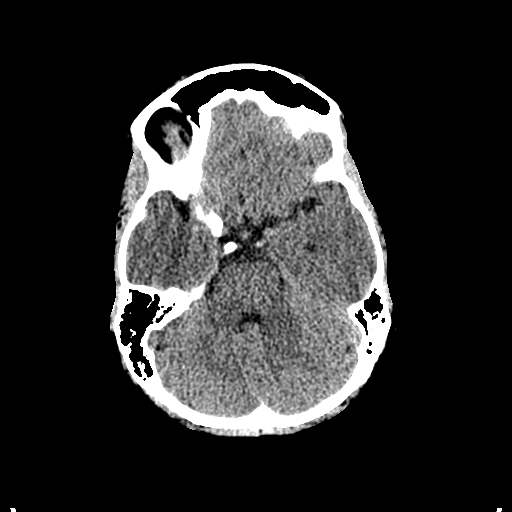
[im 67/183  brain]
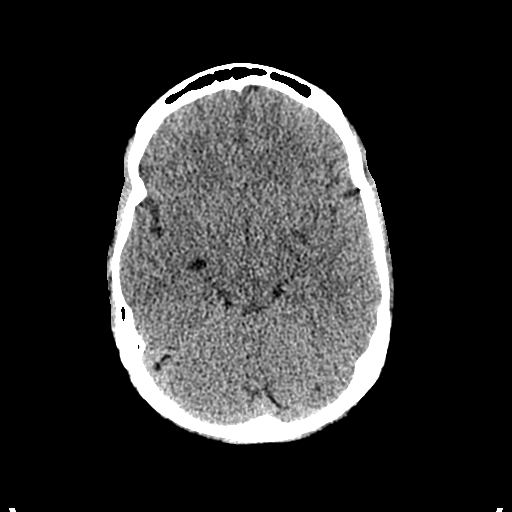
[im 83/183  brain]
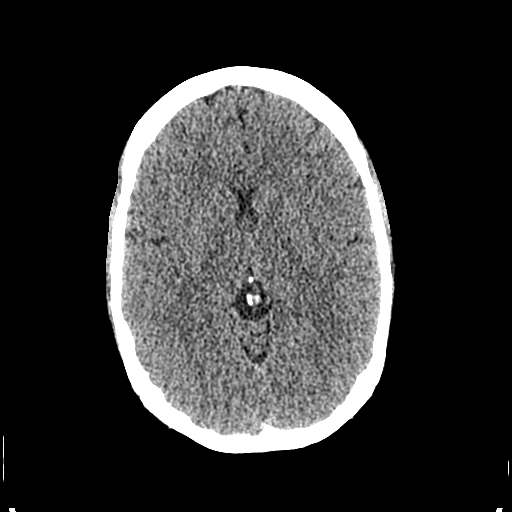
[im 83/183  bone]
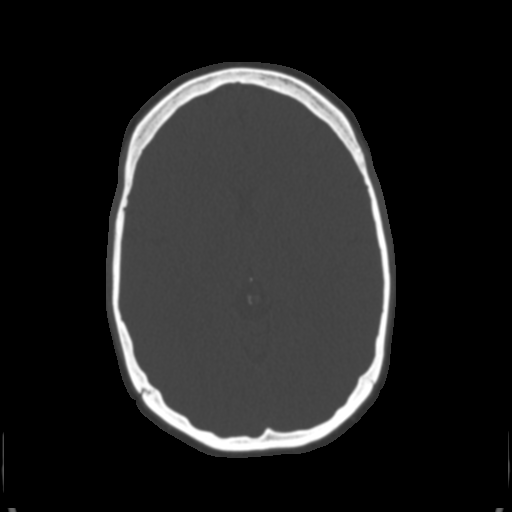
[im 100/183  brain]
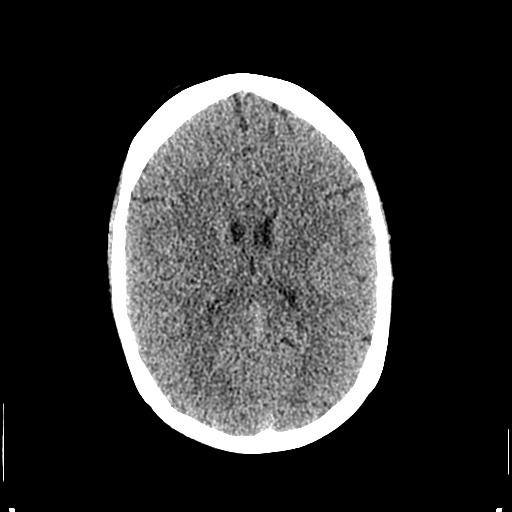
[im 116/183  brain]
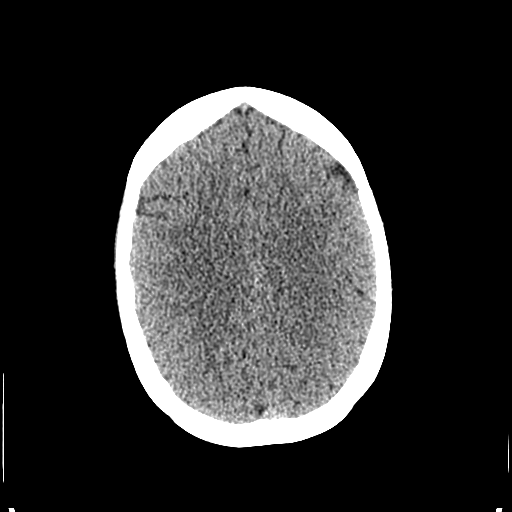
[im 133/183  brain]
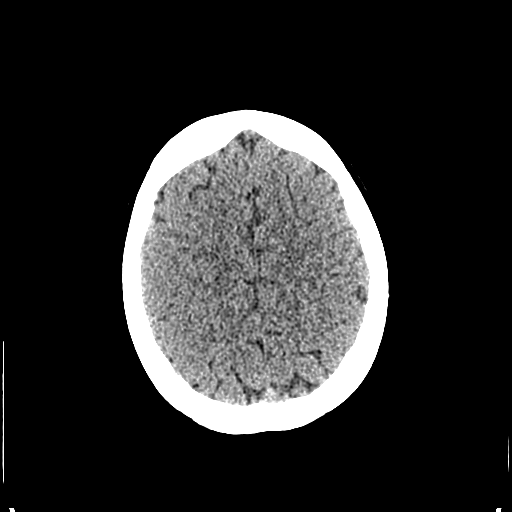
[im 149/183  brain]
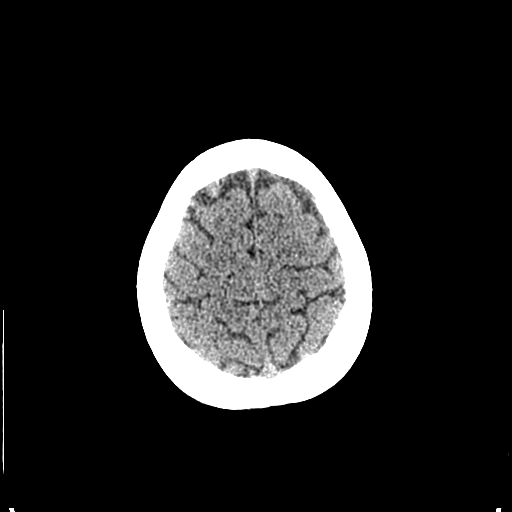
[im 149/183  bone]
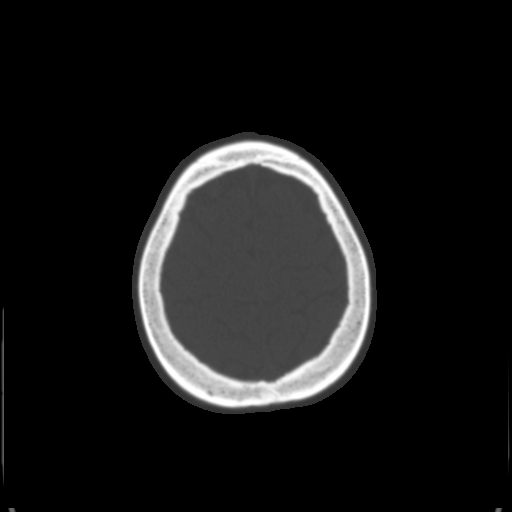
[im 166/183  brain]
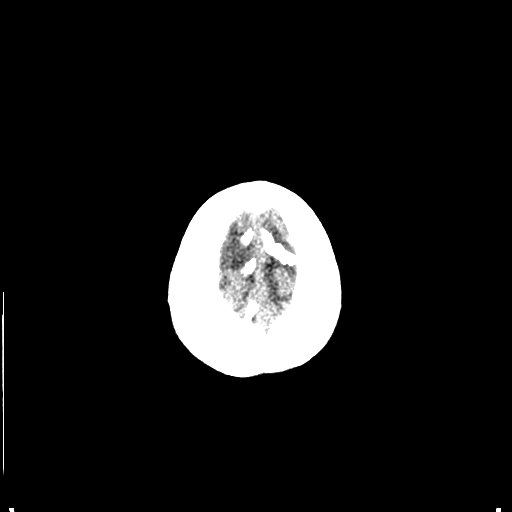

[Series 5: coronal · coronal · 0.28mm/px · 3 of 60 slices shown]
[im 20/60  brain]
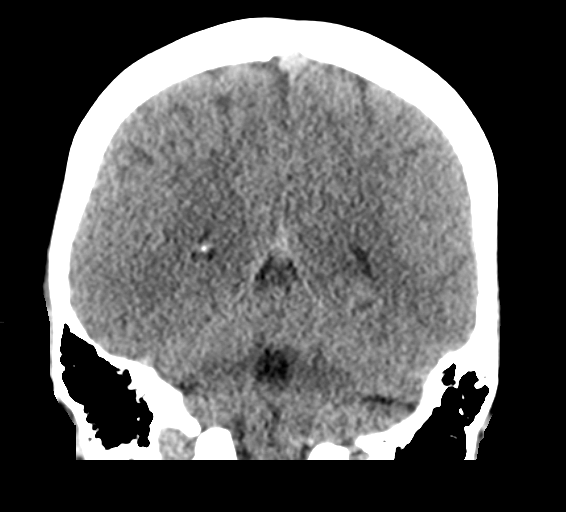
[im 27/60  brain]
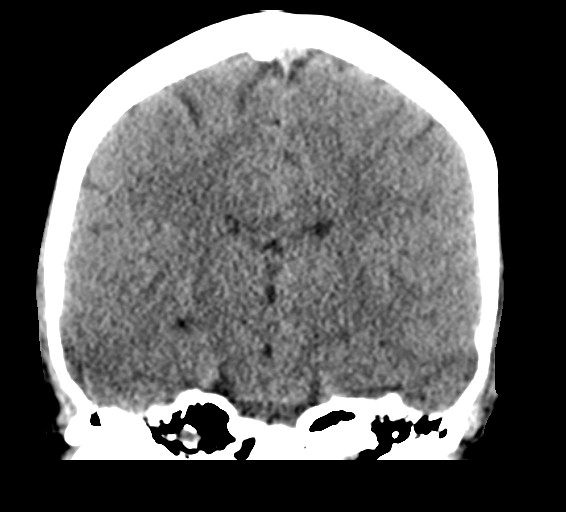
[im 33/60  brain]
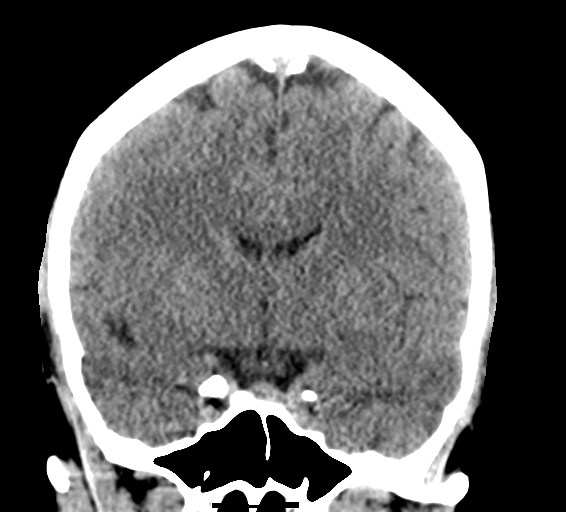

[Series 602: <mpr thick range> · sagittal · 0.45mm/px · 3 of 48 slices shown]
[im 16/48  brain]
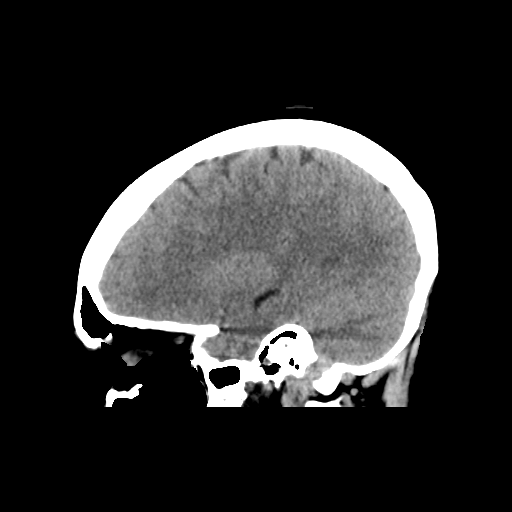
[im 24/48  brain]
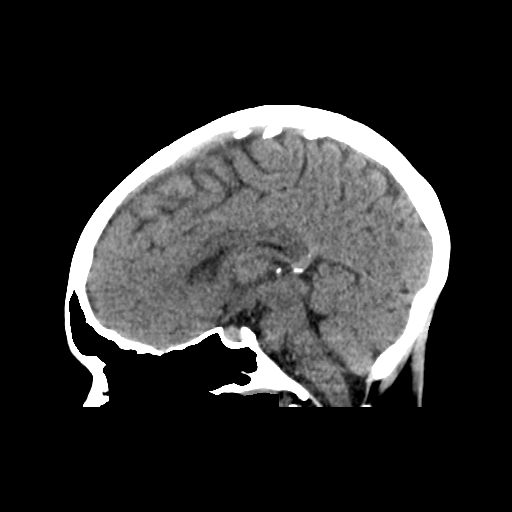
[im 32/48  brain]
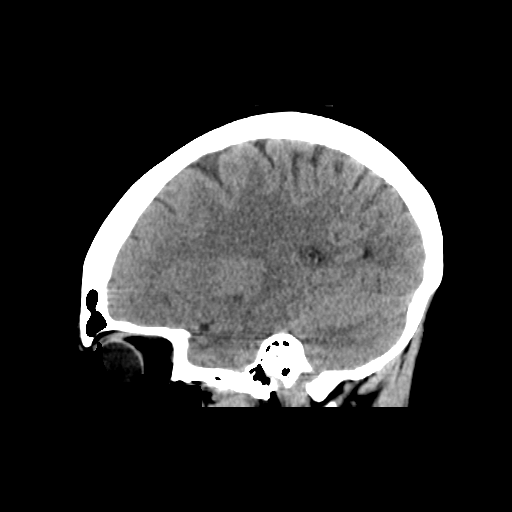

[16 of 47 positions shown; findings below may reference images not displayed]

FINDINGS: Brain: Cerebral volume is normal. No midline shift,
ventriculomegaly, mass effect, evidence of mass lesion, intracranial
hemorrhage or evidence of cortically based acute infarction.
Gray-white matter differentiation is within normal limits throughout
the brain. Axial and coronal images only in brain windows.

Vascular: No suspicious intracranial vascular hyperdensity.

Skull: No osseous abnormality identified.

Sinuses/Orbits: Clear.

Other: Negative orbit and scalp soft tissues.
IMPRESSION: Normal noncontrast CT appearance of the brain.

## 2018-01-13 ENCOUNTER — Emergency Department
Admission: EM | Admit: 2018-01-13 | Discharge: 2018-01-13 | Disposition: A | Discharge status: Home / Self Care | Payer: Medicaid Other | Attending: Emergency Medicine | Admitting: Emergency Medicine

## 2018-01-13 ENCOUNTER — Encounter: Payer: Self-pay | Admitting: Emergency Medicine

## 2018-01-13 DIAGNOSIS — Z3201 Encounter for pregnancy test, result positive: Secondary | ICD-10-CM

## 2018-01-13 DIAGNOSIS — O99340 Other mental disorders complicating pregnancy, unspecified trimester: Secondary | ICD-10-CM | POA: Insufficient documentation

## 2018-01-13 DIAGNOSIS — Z3A00 Weeks of gestation of pregnancy not specified: Secondary | ICD-10-CM | POA: Diagnosis not present

## 2018-01-13 DIAGNOSIS — Z87891 Personal history of nicotine dependence: Secondary | ICD-10-CM | POA: Diagnosis not present

## 2018-01-13 DIAGNOSIS — Z3200 Encounter for pregnancy test, result unknown: Secondary | ICD-10-CM | POA: Diagnosis present

## 2018-01-13 LAB — POCT PREGNANCY, URINE: PREG TEST UR: POSITIVE — AB

## 2018-01-13 NOTE — ED Provider Notes (Signed)
[facility] Emergency Department Provider Note  ____________________________________________   First MD Initiated Contact with Patient 01/13/18 1140     (approximate)  I have reviewed the triage vital signs and the nursing notes.   HISTORY  Chief Complaint Possible Pregnancy   HPI Alejandra Stevenson is a 32 y.o. female is here to get confirmation that she is pregnant so that she can make arrangements to be seen at the health department.  She states that she needs written confirmation before the health department will see her.  She took a home test which was positive.  Patient states that she has been trying to have another child.  Patient denies any vaginal discharge, pain, abdominal pain, fever or chills.  Past Medical History:  Diagnosis Date  . Anemia   . Bipolar disorder (HCC)   . Headache     Patient Active Problem List   Diagnosis Date Noted  . Bipolar affective disorder, current episode manic with psychotic symptoms (HCC) 05/09/2016    History reviewed. No pertinent surgical history.  Prior to Admission medications   Medication Sig Start Date End Date Taking? Authorizing Provider  lithium carbonate (ESKALITH) 450 MG CR tablet Take 1 tablet (450 mg total) by mouth 2 (two) times daily with a meal. 05/21/16   Jimmy FootmanHernandez-Gonzalez, Andrea, MD  LORazepam (ATIVAN) 1 MG tablet Take 1 tablet (1 mg total) by mouth at bedtime as needed for sleep. 05/21/16   Jimmy FootmanHernandez-Gonzalez, Andrea, MD  metoprolol tartrate (LOPRESSOR) 25 MG tablet Take 0.5 tablets (12.5 mg total) by mouth 2 (two) times daily. 05/21/16   Jimmy FootmanHernandez-Gonzalez, Andrea, MD  ziprasidone (GEODON) 80 MG capsule Take 1 capsule (80 mg total) by mouth 2 (two) times daily with a meal. 05/21/16   Jimmy FootmanHernandez-Gonzalez, Andrea, MD    Allergies Patient has no known allergies.  Family History  Problem Relation Age of Onset  . Diabetes Mellitus II Father   . CAD Father     Social History Social  History   Tobacco Use  . Smoking status: Former Smoker    Packs/day: 0.50    Types: Cigarettes  . Smokeless tobacco: Never Used  Substance Use Topics  . Alcohol use: No  . Drug use: No    Review of Systems Constitutional: No fever/chills Cardiovascular: Denies chest pain. Respiratory: Denies shortness of breath. Gastrointestinal: No abdominal pain.  No nausea, no vomiting.   Genitourinary: Amenorrhea Musculoskeletal: Negative for back pain. ____________________________________________   PHYSICAL EXAM:  VITAL SIGNS: ED Triage Vitals  Enc Vitals Group     BP 01/13/18 1054 137/78     Pulse Rate 01/13/18 1054 95     Resp 01/13/18 1054 15     Temp 01/13/18 1054 99 F (37.2 C)     Temp Source 01/13/18 1054 Oral     SpO2 01/13/18 1054 99 %     Weight 01/13/18 1052 180 lb (81.6 kg)     Height 01/13/18 1052 5\' 6"  (1.676 m)     Head Circumference --      Peak Flow --      Pain Score 01/13/18 1052 0     Pain Loc --      Pain Edu? --      Excl. in GC? --    Constitutional: Alert and oriented. Well appearing and in no acute distress. Eyes: Conjunctivae are normal.  Head: Atraumatic. Neck: No stridor.   Cardiovascular: Normal rate, regular rhythm. Grossly normal heart sounds.  Good peripheral circulation. Respiratory: Normal  respiratory effort.  No retractions. Lungs CTAB. Gastrointestinal: Soft and nontender. No distention.  Musculoskeletal: Moves upper and lower extremities with any difficulty normal gait was noted. Neurologic:  Normal speech and language. No gross focal neurologic deficits are appreciated.  Psychiatric: Mood and affect are normal. Speech and behavior are normal.  ____________________________________________   LABS (all labs ordered are listed, but only abnormal results are displayed)  Labs Reviewed  POCT PREGNANCY, URINE - Abnormal; Notable for the following components:      Result Value   Preg Test, Ur POSITIVE (*)    All other components within  normal limits  POC URINE PREG, ED    PROCEDURES  Procedure(s) performed: None  Procedures  Critical Care performed: No  ____________________________________________   INITIAL IMPRESSION / ASSESSMENT AND PLAN / ED COURSE  As part of my medical decision making, I reviewed the following data within the electronic MEDICAL RECORD NUMBER Notes from prior ED visits and [place] Controlled Substance Database  Patient was given test results confirming that she is positive for pregnancy to be taken to the health department. ____________________________________________   FINAL CLINICAL IMPRESSION(S) / ED DIAGNOSES  Final diagnoses:  Positive pregnancy test     ED Discharge Orders    None       Note:  This document was prepared using Dragon voice recognition software and may include unintentional dictation errors.    Tommi Rumps, PA-C 01/13/18 1514    Emily Filbert, MD 01/14/18 1046

## 2018-01-13 NOTE — ED Triage Notes (Signed)
Pt reports took a pregnancy test at home and it was positive so she needs confirmation and to know how far along she is. Pt denies any pain, concerns or other sx's. Just states she needs confirmation.

## 2018-01-13 NOTE — ED Notes (Signed)
See triage note   States she had a positive preg test this am states she is requesting blood draw to  See how far she is

## 2018-01-13 NOTE — Discharge Instructions (Addendum)
Call make an appointment at the health department.

## 2019-04-21 ENCOUNTER — Other Ambulatory Visit: Payer: Self-pay | Admitting: *Deleted

## 2019-04-21 DIAGNOSIS — Z20822 Contact with and (suspected) exposure to covid-19: Secondary | ICD-10-CM

## 2019-04-22 ENCOUNTER — Telehealth: Payer: Self-pay | Admitting: Family

## 2019-04-22 DIAGNOSIS — Z20822 Contact with and (suspected) exposure to covid-19: Secondary | ICD-10-CM

## 2019-04-22 LAB — NOVEL CORONAVIRUS, NAA

## 2019-04-22 MED ORDER — BENZONATATE 100 MG PO CAPS: 100.0000 mg | ORAL_CAPSULE | Freq: Three times a day (TID) | ORAL | 0 refills | Status: AC | PRN

## 2019-04-22 NOTE — Progress Notes (Signed)
E-Visit for Corona Virus Screening   Your current symptoms could be consistent with the coronavirus.  Many health care providers can now test patients at their office but not all are.  [facility] has multiple testing sites. For information on our COVID testing locations and hours go to [email or web address]  Please quarantine yourself while awaiting your test results.  We are enrolling you in our MyChart Home Montioring for COVID19 . Daily you will receive a questionnaire within the MyChart website. Our COVID 19 response team willl be monitoriing your responses daily.  You can go to one of the  testing sites listed below, while they are opened (see hours). You do not need an order and will stay in your car during the test. You do need to self isolate until your results return and if positive 14 days from when your symptoms started and until you are 3 days symptom free.   Testing Locations (Monday - Friday, 8 a.m. - 3:30 p.m.) . [place]: [facility], [address], [place], [place]  . [place]: [facility], [address], [place], [place] (entrance off [address])  . [place]: (Closed each Monday): Testing site relocated to the short stay covered drive at [facility]. (Use the [address] entrance to [facility] next to [facility].) Approximately 5 minutes was spent documenting and reviewing patient's chart.    COVID-19 is a respiratory illness with symptoms that are similar to the flu. Symptoms are typically mild to moderate, but there have been cases of severe illness and death due to the virus. The following symptoms may appear 2-14 days after exposure: . Fever . Cough . Shortness of breath or difficulty breathing . Chills . Repeated shaking with chills . Muscle pain . Headache . Sore throat . New loss of taste or smell . Fatigue . Congestion or runny  nose . Nausea or vomiting . Diarrhea  It is vitally important that if you feel that you have an infection such as this virus or any other virus that you stay home and away from places where you may spread it to others.  You should self-quarantine for 14 days if you have symptoms that could potentially be coronavirus or have been in close contact a with a person diagnosed with COVID-19 within the last 2 weeks. You should avoid contact with people age 65 and older.   You should wear a mask or cloth face covering over your nose and mouth if you must be around other people or animals, including pets (even at home). Try to stay at least 6 feet away from other people. This will protect the people around you.  You can use medication such as A prescription cough medication called Tessalon Perles 100 mg. You may take 1-2 capsules every 8 hours as needed for cough  You may also take acetaminophen (Tylenol) as needed for fever.   Reduce your risk of any infection by using the same precautions used for avoiding the common cold or flu:  . Wash your hands often with soap and warm water for at least 20 seconds.  If soap and water are not readily available, use an alcohol-based hand sanitizer with at least 60% alcohol.  . If coughing or sneezing, cover your mouth and nose by coughing or sneezing into the elbow areas of your shirt or coat, into a tissue or into your sleeve (not your hands). . Avoid shaking hands with others   and consider head nods or verbal greetings only. . Avoid touching your eyes, nose, or mouth with unwashed hands.  . Avoid close contact with people who are sick. . Avoid places or events with large numbers of people in one location, like concerts or sporting events. . Carefully consider travel plans you have or are making. . If you are planning any travel outside or inside the [place], visit the CDC's Travelers' Health webpage for the latest health notices. . If you have some symptoms but not all  symptoms, continue to monitor at home and seek medical attention if your symptoms worsen. . If you are having a medical emergency, call 911.  HOME CARE . Only take medications as instructed by your medical team. . Drink plenty of fluids and get plenty of rest. . A steam or ultrasonic humidifier can help if you have congestion.   GET HELP RIGHT AWAY IF YOU HAVE EMERGENCY WARNING SIGNS** FOR COVID-19. If you or someone is showing any of these signs seek emergency medical care immediately. Call 911 or proceed to your closest emergency facility if: . You develop worsening high fever. . Trouble breathing . Bluish lips or face . Persistent pain or pressure in the chest . New confusion . Inability to wake or stay awake . You cough up blood. . Your symptoms become more severe  **This list is not all possible symptoms. Contact your medical provider for any symptoms that are sever or concerning to you.   MAKE SURE YOU   Understand these instructions.  Will watch your condition.  Will get help right away if you are not doing well or get worse.  Your e-visit answers were reviewed by a board certified advanced clinical practitioner to complete your personal care plan.  Depending on the condition, your plan could have included both over the counter or prescription medications.  If there is a problem please reply once you have received a response from your provider.  Your safety is important to us.  If you have drug allergies check your prescription carefully.    You can use MyChart to ask questions about today's visit, request a non-urgent call back, or ask for a work or school excuse for 24 hours related to this e-Visit. If it has been greater than 24 hours you will need to follow up with your provider, or enter a new e-Visit to address those concerns. You will get an e-mail in the next two days asking about your experience.  I hope that your e-visit has been valuable and will speed your  recovery. Thank you for using e-visits.    

## 2019-08-09 ENCOUNTER — Telehealth: Payer: Medicaid Other | Admitting: Physician Assistant

## 2019-08-09 DIAGNOSIS — Z20822 Contact with and (suspected) exposure to covid-19: Secondary | ICD-10-CM

## 2019-08-09 NOTE — Progress Notes (Signed)
Message sent to patient requesting further input regarding current symptoms. Awaiting patient response.  

## 2019-08-09 NOTE — Progress Notes (Signed)
E-Visit for Corona Virus Screening  Your current symptoms could be consistent with the coronavirus.  Many health care providers can now test patients at their office but not all are.  [facility] has multiple testing sites. For information on our COVID testing locations and hours go to [email or web address]  We are enrolling you in our MyChart Home Monitoring for COVID19 . Daily you will receive a questionnaire within the MyChart website. Our COVID 19 response team will be monitoring your responses daily.  Testing Information: The COVID-19 Community Testing sites will begin testing BY APPOINTMENT ONLY.  You can schedule online at [email or web address]  If you do not have access to a smart phone or computer you may call [phone] for an appointment.   Additional testing sites in the Community:  . For [facility] Testing sites in [place]  https://www.cvs.com/minuteclinic/covid-19-testing  . For Pop-up testing sites in [place]  https://covid19.ncdhhs.gov/about-covid-19/testing/find-my-testing-place/pop-testing-sites  . For Testing sites with regular hours https://onsms.[email or web address]/[email or web address]  . For [place] [facility] [email or web address]-community-[email or web address]-[email or web address]  . For [facility] https://[place].gov[place]/human-services/health-department/coronavirus-covid-19-info/covid-19-testing  . For [place] testing in [place] and [place] https://www.[place].gov/[place]/human-services/health-department/coronavirus-covid-19-info/covid-19-testing  . For [facility] testing in [place]   https://[facility].care/covidtesting  For  more information about community testing call [phone]   Please quarantine yourself while awaiting your test results. Please stay home for a minimum of 10 days from the first day of illness with improving symptoms and you have had 24 hours of no fever (without the use of Tylenol (Acetaminophen)  Motrin (Ibuprofen) or any fever reducing medication).  Also - Do not get tested prior to returning to work because once you have had a positive test the test can stay positive for more then a month in some cases.   You should wear a mask or cloth face covering over your nose and mouth if you must be around other people or animals, including pets (even at home). Try to stay at least 6 feet away from other people. This will protect the people around you.  Please continue good preventive care measures, including:  frequent hand-washing, avoid touching your face, cover coughs/sneezes, stay out of crowds and keep a 6 foot distance from others.  COVID-19 is a respiratory illness with symptoms that are similar to the flu. Symptoms are typically mild to moderate, but there have been cases of severe illness and death due to the virus.   The following symptoms may appear 2-14 days after exposure: . Fever . Cough . Shortness of breath or difficulty breathing . Chills . Repeated shaking with chills . Muscle pain . Headache . Sore throat . New loss of taste or smell . Fatigue . Congestion or runny nose . Nausea or vomiting . Diarrhea  Go to the nearest hospital ED for assessment if fever/cough/breathlessness are severe or illness seems like a threat to life.  It is vitally important that if you feel that you have an infection such as this virus or any other virus that you stay home and away from places where you may spread it to others.  You should avoid contact with people age 65 and older.    You may also take acetaminophen (Tylenol) as needed for fever.  Reduce your risk of any infection by using the same precautions used for avoiding the common cold or flu:  . Wash your hands often with soap and warm water for at least 20 seconds.  If soap and water are not readily available, use an alcohol-based hand sanitizer with at   least 60% alcohol.  . If coughing or sneezing, cover your mouth and nose by  coughing or sneezing into the elbow areas of your shirt or coat, into a tissue or into your sleeve (not your hands). . Avoid shaking hands with others and consider head nods or verbal greetings only. . Avoid touching your eyes, nose, or mouth with unwashed hands.  . Avoid close contact with people who are sick. . Avoid places or events with large numbers of people in one location, like concerts or sporting events. . Carefully consider travel plans you have or are making. . If you are planning any travel outside or inside the [place], visit the CDC's Travelers' Health webpage for the latest health notices. . If you have some symptoms but not all symptoms, continue to monitor at home and seek medical attention if your symptoms worsen. . If you are having a medical emergency, call 911.  HOME CARE . Only take medications as instructed by your medical team. . Drink plenty of fluids and get plenty of rest. . A steam or ultrasonic humidifier can help if you have congestion.   GET HELP RIGHT AWAY IF YOU HAVE EMERGENCY WARNING SIGNS** FOR COVID-19. If you or someone is showing any of these signs seek emergency medical care immediately. Call 911 or proceed to your closest emergency facility if: . You develop worsening high fever. . Trouble breathing . Bluish lips or face . Persistent pain or pressure in the chest . New confusion . Inability to wake or stay awake . You cough up blood. . Your symptoms become more severe  **This list is not all possible symptoms. Contact your medical provider for any symptoms that are sever or concerning to you.  MAKE SURE YOU   Understand these instructions.  Will watch your condition.  Will get help right away if you are not doing well or get worse.  Your e-visit answers were reviewed by a board certified advanced clinical practitioner to complete your personal care plan.  Depending on the condition, your plan could have included both over the counter or  prescription medications.  If there is a problem please reply once you have received a response from your provider.  Your safety is important to us.  If you have drug allergies check your prescription carefully.    You can use MyChart to ask questions about today's visit, request a non-urgent call back, or ask for a work or school excuse for 24 hours related to this e-Visit. If it has been greater than 24 hours you will need to follow up with your provider, or enter a new e-Visit to address those concerns. You will get an e-mail in the next two days asking about your experience.  I hope that your e-visit has been valuable and will speed your recovery. Thank you for using e-visits.    

## 2019-08-09 NOTE — Progress Notes (Signed)
I have spent 5 minutes in review of e-visit questionnaire, review and updating patient chart, medical decision making and response to patient.   Rivka Baune Cody Kalyan Barabas, PA-C    

## 2019-08-10 ENCOUNTER — Other Ambulatory Visit: Payer: Medicaid Other

## 2019-08-10 ENCOUNTER — Telehealth: Payer: Medicaid Other | Admitting: Physician Assistant

## 2019-08-10 DIAGNOSIS — R05 Cough: Secondary | ICD-10-CM

## 2019-08-10 DIAGNOSIS — J069 Acute upper respiratory infection, unspecified: Secondary | ICD-10-CM

## 2019-08-10 DIAGNOSIS — R059 Cough, unspecified: Secondary | ICD-10-CM

## 2019-08-10 MED ORDER — BENZONATATE 100 MG PO CAPS: 100.0000 mg | ORAL_CAPSULE | Freq: Three times a day (TID) | ORAL | 0 refills | Status: AC | PRN

## 2019-08-10 MED ORDER — FLUTICASONE PROPIONATE 50 MCG/ACT NA SUSP: 2.0000 | Freq: Every day | NASAL | 6 refills | Status: AC

## 2019-08-10 NOTE — Progress Notes (Signed)
Hi Alejandra Stevenson,   Please keep in mind that if you feel like you have had exposure to COVID-19, you need to quarantine for 10 days from symptom onset.  It would be a shame to expose others.    Based on what you have shared with me, it looks like you may have a viral upper respiratory infection.  Upper respiratory infections are caused by a large number of viruses; however, rhinovirus is the most common cause.   Symptoms vary from person to person, with common symptoms including sore throat, cough, fatigue or lack of energy and feeling of general discomfort.  A low-grade fever of up to 100.4 may present, but is often uncommon.  Symptoms vary however, and are closely related to a person's age or underlying illnesses.  The most common symptoms associated with an upper respiratory infection are nasal discharge or congestion, cough, sneezing, headache and pressure in the ears and face.  These symptoms usually persist for about 3 to 10 days, but can last up to 2 weeks.  It is important to know that upper respiratory infections do not cause serious illness or complications in most cases.    Upper respiratory infections can be transmitted from person to person, with the most common method of transmission being a person's hands.  The virus is able to live on the skin and can infect other persons for up to 2 hours after direct contact.  Also, these can be transmitted when someone coughs or sneezes; thus, it is important to cover the mouth to reduce this risk.  To keep the spread of the illness at bay, good hand hygiene is very important.  This is an infection that is most likely caused by a virus. There are no specific treatments other than to help you with the symptoms until the infection runs its course.  We are sorry you are not feeling well.  Here is how we plan to help!   For nasal congestion, you may use an oral decongestants such as Mucinex D or if you have glaucoma or high blood pressure use plain Mucinex.   Saline nasal spray or nasal drops can help and can safely be used as often as needed for congestion.  For your congestion, I have prescribed Fluticasone nasal spray one spray in each nostril twice a day  If you do not have a history of heart disease, hypertension, diabetes or thyroid disease, prostate/bladder issues or glaucoma, you may also use Sudafed to treat nasal congestion.  It is highly recommended that you consult with a pharmacist or your primary care physician to ensure this medication is safe for you to take.     If you have a cough, you may use cough suppressants such as Delsym and Robitussin.  If you have glaucoma or high blood pressure, you can also use Coricidin HBP.   For cough I have prescribed for you A prescription cough medication called Tessalon Perles 100 mg. You may take 1-2 capsules every 8 hours as needed for cough  If you have a sore or scratchy throat, use a saltwater gargle-  to  teaspoon of salt dissolved in a 4-ounce to 8-ounce glass of warm water.  Gargle the solution for approximately 15-30 seconds and then spit.  It is important not to swallow the solution.  You can also use throat lozenges/cough drops and Chloraseptic spray to help with throat pain or discomfort.  Warm or cold liquids can also be helpful in relieving throat pain.  For headache,  pain or general discomfort, you can use Ibuprofen or Tylenol as directed.   Some authorities believe that zinc sprays or the use of Echinacea may shorten the course of your symptoms.   HOME CARE . Only take medications as instructed by your medical team. . Be sure to drink plenty of fluids. Water is fine as well as fruit juices, sodas and electrolyte beverages. You may want to stay away from caffeine or alcohol. If you are nauseated, try taking small sips of liquids. How do you know if you are getting enough fluid? Your urine should be a pale yellow or almost colorless. . Get rest. . Taking a steamy shower or using a  humidifier may help nasal congestion and ease sore throat pain. You can place a towel over your head and breathe in the steam from hot water coming from a faucet. . Using a saline nasal spray works much the same way. . Cough drops, hard candies and sore throat lozenges may ease your cough. . Avoid close contacts especially the very young and the elderly . Cover your mouth if you cough or sneeze . Always remember to wash your hands.   GET HELP RIGHT AWAY IF: . You develop worsening fever. . If your symptoms do not improve within 10 days . You develop yellow or green discharge from your nose over 3 days. . You have coughing fits . You develop a severe head ache or visual changes. . You develop shortness of breath, difficulty breathing or start having chest pain . Your symptoms persist after you have completed your treatment plan  MAKE SURE YOU   Understand these instructions.  Will watch your condition.  Will get help right away if you are not doing well or get worse.  Your e-visit answers were reviewed by a board certified advanced clinical practitioner to complete your personal care plan. Depending upon the condition, your plan could have included both over the counter or prescription medications. Please review your pharmacy choice. If there is a problem, you may call our nursing hot line at and have the prescription routed to another pharmacy. Your safety is important to [place]. If you have drug allergies check your prescription carefully.   You can use MyChart to ask questions about today's visit, request a non-urgent call back, or ask for a work or school excuse for 24 hours related to this e-Visit. If it has been greater than 24 hours you will need to follow up with your provider, or enter a new e-Visit to address those concerns. You will get an e-mail in the next two days asking about your experience.  I hope that your e-visit has been valuable and will speed your recovery. Thank you  for using e-visits.     Greater than 5 minutes, yet less than 10 minutes of time have been spent researching, coordinating and implementing care for this patient today.

## 2019-08-11 ENCOUNTER — Ambulatory Visit: Payer: Medicaid Other | Attending: Internal Medicine

## 2019-08-11 ENCOUNTER — Other Ambulatory Visit: Payer: Medicaid Other

## 2019-08-11 ENCOUNTER — Other Ambulatory Visit: Payer: Self-pay

## 2019-08-11 DIAGNOSIS — Z20822 Contact with and (suspected) exposure to covid-19: Secondary | ICD-10-CM

## 2019-08-12 LAB — NOVEL CORONAVIRUS, NAA: SARS-CoV-2, NAA: DETECTED — AB

## 2019-08-17 ENCOUNTER — Other Ambulatory Visit: Payer: Medicaid Other

## 2019-08-18 ENCOUNTER — Other Ambulatory Visit: Payer: Medicaid Other

## 2021-04-04 ENCOUNTER — Ambulatory Visit: Payer: Medicaid Other | Admitting: Nurse Practitioner

## 2021-04-10 ENCOUNTER — Ambulatory Visit: Payer: Medicaid Other | Admitting: Nurse Practitioner
